# Patient Record
Sex: Female | Born: 1935 | Race: White | Hispanic: No | Smoking: Never smoker
Health system: Southern US, Community
[De-identification: ages and names within clinical notes are randomized; demographics above are authoritative.]

## PROBLEM LIST (undated history)

## (undated) DIAGNOSIS — E785 Hyperlipidemia, unspecified: Secondary | ICD-10-CM

## (undated) DIAGNOSIS — F32A Depression, unspecified: Secondary | ICD-10-CM

## (undated) DIAGNOSIS — F0280 Dementia in other diseases classified elsewhere without behavioral disturbance: Secondary | ICD-10-CM

## (undated) DIAGNOSIS — G309 Alzheimer's disease, unspecified: Secondary | ICD-10-CM

## (undated) DIAGNOSIS — N3281 Overactive bladder: Secondary | ICD-10-CM

## (undated) DIAGNOSIS — Z8781 Personal history of (healed) traumatic fracture: Secondary | ICD-10-CM

## (undated) DIAGNOSIS — F329 Major depressive disorder, single episode, unspecified: Secondary | ICD-10-CM

## (undated) DIAGNOSIS — R42 Dizziness and giddiness: Secondary | ICD-10-CM

## (undated) DIAGNOSIS — R413 Other amnesia: Secondary | ICD-10-CM

## (undated) DIAGNOSIS — G4730 Sleep apnea, unspecified: Secondary | ICD-10-CM

## (undated) HISTORY — PX: KNEE SURGERY: SHX244

## (undated) HISTORY — PX: ANKLE SURGERY: SHX546

## (undated) HISTORY — PX: BACK SURGERY: SHX140

## (undated) HISTORY — DX: Personal history of (healed) traumatic fracture: Z87.81

---

## 1997-08-11 ENCOUNTER — Ambulatory Visit (HOSPITAL_COMMUNITY): Admission: RE | Admit: 1997-08-11 | Discharge: 1997-08-11 | Payer: Self-pay | Admitting: *Deleted

## 1997-08-16 ENCOUNTER — Ambulatory Visit (HOSPITAL_COMMUNITY): Admission: RE | Admit: 1997-08-16 | Discharge: 1997-08-16 | Payer: Self-pay | Admitting: Internal Medicine

## 1998-08-24 ENCOUNTER — Ambulatory Visit (HOSPITAL_COMMUNITY): Admission: RE | Admit: 1998-08-24 | Discharge: 1998-08-24 | Payer: Self-pay | Admitting: Internal Medicine

## 1998-08-24 ENCOUNTER — Ambulatory Visit (HOSPITAL_COMMUNITY): Admission: RE | Admit: 1998-08-24 | Discharge: 1998-08-24 | Payer: Self-pay | Admitting: *Deleted

## 1999-08-02 ENCOUNTER — Ambulatory Visit (HOSPITAL_COMMUNITY): Admission: RE | Admit: 1999-08-02 | Discharge: 1999-08-02 | Payer: Self-pay | Admitting: Obstetrics & Gynecology

## 1999-08-29 ENCOUNTER — Ambulatory Visit (HOSPITAL_COMMUNITY): Admission: RE | Admit: 1999-08-29 | Discharge: 1999-08-29 | Payer: Self-pay | Admitting: Obstetrics & Gynecology

## 2000-10-09 ENCOUNTER — Ambulatory Visit (HOSPITAL_COMMUNITY): Admission: RE | Admit: 2000-10-09 | Discharge: 2000-10-09 | Payer: Self-pay

## 2000-10-09 ENCOUNTER — Ambulatory Visit (HOSPITAL_COMMUNITY): Admission: RE | Admit: 2000-10-09 | Discharge: 2000-10-09 | Payer: Self-pay | Admitting: Obstetrics & Gynecology

## 2001-10-15 ENCOUNTER — Ambulatory Visit (HOSPITAL_COMMUNITY): Admission: RE | Admit: 2001-10-15 | Discharge: 2001-10-15 | Payer: Self-pay | Admitting: Internal Medicine

## 2001-10-15 ENCOUNTER — Ambulatory Visit (HOSPITAL_COMMUNITY): Admission: RE | Admit: 2001-10-15 | Discharge: 2001-10-15 | Payer: Self-pay | Admitting: *Deleted

## 2002-10-21 ENCOUNTER — Ambulatory Visit (HOSPITAL_COMMUNITY): Admission: RE | Admit: 2002-10-21 | Discharge: 2002-10-21 | Payer: Self-pay | Admitting: *Deleted

## 2016-06-06 ENCOUNTER — Emergency Department (HOSPITAL_BASED_OUTPATIENT_CLINIC_OR_DEPARTMENT_OTHER): Payer: Medicare Other

## 2016-06-06 ENCOUNTER — Encounter (HOSPITAL_BASED_OUTPATIENT_CLINIC_OR_DEPARTMENT_OTHER): Payer: Self-pay | Admitting: *Deleted

## 2016-06-06 ENCOUNTER — Emergency Department (HOSPITAL_BASED_OUTPATIENT_CLINIC_OR_DEPARTMENT_OTHER)
Admission: EM | Admit: 2016-06-06 | Discharge: 2016-06-06 | Disposition: A | Discharge status: Home / Self Care | Payer: Medicare Other | Attending: Emergency Medicine | Admitting: Emergency Medicine

## 2016-06-06 DIAGNOSIS — Z79899 Other long term (current) drug therapy: Secondary | ICD-10-CM | POA: Diagnosis not present

## 2016-06-06 DIAGNOSIS — M79642 Pain in left hand: Secondary | ICD-10-CM | POA: Diagnosis present

## 2016-06-06 DIAGNOSIS — M7989 Other specified soft tissue disorders: Secondary | ICD-10-CM | POA: Insufficient documentation

## 2016-06-06 HISTORY — DX: Hyperlipidemia, unspecified: E78.5

## 2016-06-06 HISTORY — DX: Overactive bladder: N32.81

## 2016-06-06 HISTORY — DX: Sleep apnea, unspecified: G47.30

## 2016-06-06 MED ORDER — CEPHALEXIN 500 MG PO CAPS: 500.0000 mg | ORAL_CAPSULE | Freq: Two times a day (BID) | ORAL | 0 refills | Status: AC

## 2016-06-06 NOTE — Discharge Instructions (Signed)
We believe that your symptoms are caused by an injury to the hand  Please read through the included information about additional care such as heating pads, over-the-counter pain medicine.  If you were provided a prescription please use it only as needed and as instructed.  Remember that early mobility and using the affected part of your body is actually better than keeping it immobile.  We are putting you in a wrist brace and will have you get repeat x-rays in 1 week to make sure the brace does not need to stay on.   You may a slight skin infection as well which we will treat with antibiotics for the next week.   Follow-up with the doctor listed as recommended or return to the emergency department with new or worsening symptoms that concern you.

## 2016-06-06 NOTE — ED Provider Notes (Signed)
Emergency Department Provider Note   I have reviewed the triage vital signs and the nursing notes.  Level 5 caveat: Dementia   HISTORY  Chief Complaint Hand Problem   HPI Dawn Gordon is a 83 y.o. female with PMH of dementia, HLD, and sleep apnea resents to the emergency department for evaluation of left wrist pain and swelling. The patient is not sure if she fell and injured the wrist but staff at her assisted living noticed that it was red and swollen. Patient denies any pain with movement. No fevers or chills. He does not recall any falls. History is limited by her underlying dementia.   Past Medical History:  Diagnosis Date  . Hyperlipidemia   . Overactive bladder   . Sleep apnea     There are no active problems to display for this patient.   Past Surgical History:  Procedure Laterality Date  . ANKLE SURGERY    . BACK SURGERY    . KNEE SURGERY      Current Outpatient Rx  . Order #: [id number] Class: Historical Med  . Order #: [id number] Class: Historical Med  . Order #: [id number] Class: Historical Med  . Order #: [id number] Class: Historical Med  . Order #: [id number] Class: Historical Med  . Order #: [id number] Class: Historical Med  . Order #: [id number] Class: Print    Allergies Patient has no known allergies.  No family history on file.  Social History Social History  Substance Use Topics  . Smoking status: Never Smoker  . Smokeless tobacco: Never Used  . Alcohol use No    Review of Systems  Constitutional: No fever/chills Eyes: No visual changes. ENT: No sore throat. Cardiovascular: Denies chest pain. Respiratory: Denies shortness of breath. Gastrointestinal: No abdominal pain.  No nausea, no vomiting.  No diarrhea.  No constipation. Genitourinary: Negative for dysuria. Musculoskeletal: Negative for back pain. Wrist pain and swelling.  Skin: Negative for rash. Neurological: Negative for headaches, focal weakness or numbness.  10-point ROS otherwise  negative.  ____________________________________________   PHYSICAL EXAM:  VITAL SIGNS: Temp: 98.9 F Pulse: 53 BP: 134/70 SpO2: 98% RA  Constitutional: Alert and oriented. Well appearing and in no acute distress. Eyes: Conjunctivae are normal.  Head: Atraumatic. Nose: No congestion/rhinnorhea. Mouth/Throat: Mucous membranes are moist.  Neck: No stridor.   Cardiovascular: Normal rate, regular rhythm. Good peripheral circulation. Grossly normal heart sounds.   Respiratory: Normal respiratory effort.  No retractions. Lungs CTAB. Gastrointestinal: Soft and nontender. No distention.  Musculoskeletal: No lower extremity tenderness nor edema. No gross deformities of extremities. Left wrist erythema, swelling, and mild ecchymosis.  Neurologic:  Normal speech and language. No gross focal neurologic deficits are appreciated.  Skin:  Skin is warm, dry and intact. No rash noted. Psychiatric: Mood and affect are normal. Speech and behavior are normal.  ____________________________________________  RADIOLOGY  No results found.  ____________________________________________   PROCEDURES  Procedure(s) performed:   .Splint Application Date/Time: 06/08/2016 2:20 PM Performed by: Maia Plan Authorized by: Maia Plan   Consent:    Consent obtained:  Verbal   Consent given by:  Patient   Risks discussed:  Discoloration, numbness, pain and swelling   Alternatives discussed:  Delayed treatment Procedure details:    Laterality:  Left   Location:  Wrist   Wrist:  L wrist   Strapping: no     Splint type:  Thumb spica   Supplies:  Prefabricated splint Post-procedure details:    Pain:  Unchanged   Sensation:  Normal  Patient tolerance of procedure:  Tolerated well, no immediate complications    ____________________________________________   INITIAL IMPRESSION / ASSESSMENT AND PLAN / ED COURSE  Pertinent labs & imaging results that were available during my care of the  patient were reviewed by me and considered in my medical decision making (see chart for details).  Patient has left wrist swelling with tenderness over the first metacarpal. Full range of motion of the wrist and fingers with no difficulty. She has some redness and swelling over the wrist with no evidence of abscess. Question possible cellulitis versus edema from recent injury. X-ray shows no acute fracture or dislocation. I placed her in a Velcro splint for support as she has some tenderness to palpation. Plan for repeat x-ray in 1 week to rule out underlying fracture. Plan to also start on Keflex with possible cellulitis. Discussed return precautions for septic arthritis in detail with patient and family at bedside but no concern for that at this time.   At this time, I do not feel there is any life-threatening condition present. I have reviewed and discussed all results (EKG, imaging, lab, urine as appropriate), exam findings with patient. I have reviewed nursing notes and appropriate previous records.  I feel the patient is safe to be discharged home without further emergent workup. Discussed usual and customary return precautions. Patient and family (if present) verbalize understanding and are comfortable with this plan.  Patient will follow-up with their primary care provider. If they do not have a primary care provider, information for follow-up has been provided to them. All questions have been answered.  ____________________________________________  FINAL CLINICAL IMPRESSION(S) / ED DIAGNOSES  Final diagnoses:  Pain of left hand     MEDICATIONS GIVEN DURING THIS VISIT:  None  NEW OUTPATIENT MEDICATIONS STARTED DURING THIS VISIT:  Discharge Medication List as of 06/06/2016  6:14 PM    START taking these medications   Details  cephALEXin (KEFLEX) 500 MG capsule Take 1 capsule (500 mg total) by mouth 2 (two) times daily., Starting Fri 06/06/2016, Until Fri 06/13/2016, Print           Note:  This document was prepared using Dragon voice recognition software and may include unintentional dictation errors.  Alona Bene, MD Emergency Medicine   Maia Plan, MD 06/08/16 [phone]

## 2016-06-06 NOTE — ED Triage Notes (Signed)
Sister is bringing her from [facility] with swelling to her left hand. Unknown injury.

## 2016-06-06 NOTE — ED Notes (Signed)
Patient transported to X-ray 

## 2016-06-13 ENCOUNTER — Emergency Department (HOSPITAL_BASED_OUTPATIENT_CLINIC_OR_DEPARTMENT_OTHER)
Admission: EM | Admit: 2016-06-13 | Discharge: 2016-06-13 | Disposition: A | Discharge status: Home / Self Care | Payer: Medicare Other | Attending: Emergency Medicine | Admitting: Emergency Medicine

## 2016-06-13 ENCOUNTER — Emergency Department (HOSPITAL_BASED_OUTPATIENT_CLINIC_OR_DEPARTMENT_OTHER): Payer: Medicare Other

## 2016-06-13 ENCOUNTER — Encounter (HOSPITAL_BASED_OUTPATIENT_CLINIC_OR_DEPARTMENT_OTHER): Payer: Self-pay | Admitting: *Deleted

## 2016-06-13 DIAGNOSIS — Y939 Activity, unspecified: Secondary | ICD-10-CM | POA: Insufficient documentation

## 2016-06-13 DIAGNOSIS — Y999 Unspecified external cause status: Secondary | ICD-10-CM | POA: Insufficient documentation

## 2016-06-13 DIAGNOSIS — R51 Headache: Secondary | ICD-10-CM | POA: Diagnosis not present

## 2016-06-13 DIAGNOSIS — W19XXXA Unspecified fall, initial encounter: Secondary | ICD-10-CM

## 2016-06-13 DIAGNOSIS — W1839XA Other fall on same level, initial encounter: Secondary | ICD-10-CM | POA: Diagnosis not present

## 2016-06-13 DIAGNOSIS — S20212A Contusion of left front wall of thorax, initial encounter: Secondary | ICD-10-CM | POA: Insufficient documentation

## 2016-06-13 DIAGNOSIS — S299XXA Unspecified injury of thorax, initial encounter: Secondary | ICD-10-CM | POA: Diagnosis present

## 2016-06-13 DIAGNOSIS — Y929 Unspecified place or not applicable: Secondary | ICD-10-CM | POA: Insufficient documentation

## 2016-06-13 NOTE — ED Provider Notes (Signed)
[facility]   CSN: [id number] Arrival date & time: 06/13/16  1429     History   Chief Complaint Chief Complaint  Patient presents with  . Fall    HPI Dawn Gordon is a 83 y.o. female.  Patient is a 83 year old female with a history of dementia and hyperlipidemia who presents with left rib pain. She is brought in by her son. She lives in an assisted living facility. She was believed to have a fall about a week ago. She was seen after what was felt to be a fall with left wrist pain. That has improved but since then she's been complaining of pain in her left ribs. It seems to be worse when she's trying to get up out of bed per her son. She hasn't been complaining of any other injuries. I spoke with the nursing staff at the facility who states that she's been at her baseline mental status. No known fevers. She's been eating normally and has her normal activity level per the staff. They do say that she's been complaining that her head has been hurting.      Past Medical History:  Diagnosis Date  . Hyperlipidemia   . Overactive bladder   . Sleep apnea     There are no active problems to display for this patient.   Past Surgical History:  Procedure Laterality Date  . ANKLE SURGERY    . BACK SURGERY    . KNEE SURGERY      OB History    No data available       Home Medications    Prior to Admission medications   Medication Sig Start Date End Date Taking? Authorizing Provider  cephALEXin (KEFLEX) 500 MG capsule Take 1 capsule (500 mg total) by mouth 2 (two) times daily. 06/06/16 06/13/16  Maia Plan, MD  donepezil (ARICEPT) 10 MG tablet Take 10 mg by mouth at bedtime.    Historical Provider, MD  escitalopram (LEXAPRO) 10 MG tablet Take 10 mg by mouth daily.    Historical Provider, MD  MECLIZINE HCL PO Take by mouth.    Historical Provider, MD  Memantine HCl (NAMENDA PO) Take by mouth.    Historical Provider, MD  SIMVASTATIN PO Take by mouth.     Historical Provider, MD  TRAZODONE HCL PO Take by mouth.    Historical Provider, MD    Family History No family history on file.  Social History Social History  Substance Use Topics  . Smoking status: Never Smoker  . Smokeless tobacco: Never Used  . Alcohol use No     Allergies   Patient has no known allergies.   Review of Systems Review of Systems  Unable to perform ROS: Dementia     Physical Exam Updated Vital Signs BP 140/83   Pulse 65   Temp 98.3 F (36.8 C) (Oral)   Resp 18   Wt 150 lb (68 kg)   SpO2 96%   BMI 29.29 kg/m   Physical Exam  Constitutional: She appears well-developed and well-nourished.  HENT:  Head: Normocephalic and atraumatic.  Eyes: Pupils are equal, round, and reactive to light.  Neck:  Patient has tenderness to the cervical and lumbosacral spine. No significant tenderness to the thoracic spine.  Cardiovascular: Normal rate, regular rhythm and normal heart sounds.   Pulmonary/Chest: Effort normal and breath sounds normal. No respiratory distress. She has no wheezes. She has no rales. She exhibits no tenderness.  There is some  mild tenderness to the left mid and posterior ribs. No crepitus or deformity. No evidence of external trauma  Abdominal: Soft. Bowel sounds are normal. There is no tenderness. There is no rebound and no guarding.  Musculoskeletal: Normal range of motion. She exhibits no edema.  No pain on palpation or range of motion extremities. The left wrist which was swollen earlier this week is currently normal in appearance. There is no warmth or erythema. No visible swelling. No pain on range of motion.  Lymphadenopathy:    She has no cervical adenopathy.  Neurological: She is alert.  Confused, at baseline mental status per family. She's moving all extremities symmetrically without focal deficits.  Skin: Skin is warm and dry. No rash noted.  Psychiatric: She has a normal mood and affect.     ED Treatments / Results   Labs (all labs ordered are listed, but only abnormal results are displayed) Labs Reviewed - No data to display  EKG  EKG Interpretation None       Radiology Dg Ribs Unilateral W/chest Left  Result Date: 06/13/2016 CLINICAL DATA:  Larey Seat 1 week ago, LEFT lateral posterior rib pain EXAM: LEFT RIBS AND CHEST - 3+ VIEW COMPARISON:  Chest radiograph 09/20/2008 FINDINGS: Enlargement of cardiac silhouette. Calcified tortuous aorta. Mediastinal contours and pulmonary vascularity normal. Decreased lung volumes with bibasilar atelectasis. Mild central peribronchial thickening. No infiltrate, pleural effusion or pneumothorax. Bones demineralized. BB placed at the lower lateral LEFT chest. No definite rib fracture or bone destruction identified though the sensitivity for detection of rib fractures is decrease in the setting of significant demineralization. IMPRESSION: Bronchitic changes with minimal bibasilar atelectasis. No definite acute rib abnormalities. Electronically Signed   By: Ulyses Southward M.D.   On: 06/13/2016 15:06   Dg Lumbar Spine Complete  Result Date: 06/13/2016 CLINICAL DATA:  Previous fall with back pain, initial encounter EXAM: LUMBAR SPINE - COMPLETE 4+ VIEW COMPARISON:  07/05/2014 FINDINGS: Five lumbar type vertebral bodies are well visualized. A mild scoliosis concave to the right is noted stable from the prior exam. Vertebral body height is well maintained. Mild facet hypertrophic changes are noted. Osteophytic changes are noted as well. Diffuse aortic calcifications are seen without aneurysmal dilatation. IMPRESSION: Degenerative change without acute abnormality. Electronically Signed   By: Alcide Clever M.D.   On: 06/13/2016 16:00   Ct Head Wo Contrast  Result Date: 06/13/2016 CLINICAL DATA:  Larey Seat 1 week ago, dementia, headache EXAM: CT HEAD WITHOUT CONTRAST CT CERVICAL SPINE WITHOUT CONTRAST TECHNIQUE: Multidetector CT imaging of the head and cervical spine was performed following  the standard protocol without intravenous contrast. Multiplanar CT image reconstructions of the cervical spine were also generated. COMPARISON:  CT head 10/14/2015 FINDINGS: CT HEAD FINDINGS Brain: Generalized atrophy. Normal ventricular morphology. No midline shift or mass effect. Small vessel chronic ischemic changes of deep cerebral white matter. No intracranial hemorrhage, mass lesion, evidence of acute infarction, or extra-axial fluid collection. Vascular: Minimal atherosclerotic calcification at the internal carotid arteries at the skullbase Skull: Intact. Sinuses/Orbits: Clear Other: N/A CT CERVICAL SPINE FINDINGS Alignment: Normal Skull base and vertebrae: Diffuse osseous demineralization. Scattered facet degenerative changes. Vertebral body heights maintained without fracture or subluxation. Visualized skullbase intact. Soft tissues and spinal canal: Prevertebral soft tissues normal thickness. Scattered beam hardening artifacts of dental origin. Nonspecific LEFT parotid mass versus extra parotid mass/lymph node 12 x 10 mm in size. Disc levels: Multilevel disc space narrowing and endplate spur formation. Uncovertebral spurs encroach upon cervical neural foramina bilaterally  greatest at C4-C5. Upper chest: Lung apices clear Other: N/A IMPRESSION: Atrophy with small vessel chronic ischemic changes of deep cerebral white matter. No acute intracranial abnormalities. Degenerative disc and facet disease changes cervical spine with osseous demineralization. No acute cervical spine abnormalities. 12 x 10 mm LEFT parotid mass versus extra parotid mass or mildly enlarged lymph node incidentally noted. Electronically Signed   By: Ulyses Southward M.D.   On: 06/13/2016 16:17   Ct Cervical Spine Wo Contrast  Result Date: 06/13/2016 CLINICAL DATA:  Larey Seat 1 week ago, dementia, headache EXAM: CT HEAD WITHOUT CONTRAST CT CERVICAL SPINE WITHOUT CONTRAST TECHNIQUE: Multidetector CT imaging of the head and cervical spine was  performed following the standard protocol without intravenous contrast. Multiplanar CT image reconstructions of the cervical spine were also generated. COMPARISON:  CT head 10/14/2015 FINDINGS: CT HEAD FINDINGS Brain: Generalized atrophy. Normal ventricular morphology. No midline shift or mass effect. Small vessel chronic ischemic changes of deep cerebral white matter. No intracranial hemorrhage, mass lesion, evidence of acute infarction, or extra-axial fluid collection. Vascular: Minimal atherosclerotic calcification at the internal carotid arteries at the skullbase Skull: Intact. Sinuses/Orbits: Clear Other: N/A CT CERVICAL SPINE FINDINGS Alignment: Normal Skull base and vertebrae: Diffuse osseous demineralization. Scattered facet degenerative changes. Vertebral body heights maintained without fracture or subluxation. Visualized skullbase intact. Soft tissues and spinal canal: Prevertebral soft tissues normal thickness. Scattered beam hardening artifacts of dental origin. Nonspecific LEFT parotid mass versus extra parotid mass/lymph node 12 x 10 mm in size. Disc levels: Multilevel disc space narrowing and endplate spur formation. Uncovertebral spurs encroach upon cervical neural foramina bilaterally greatest at C4-C5. Upper chest: Lung apices clear Other: N/A IMPRESSION: Atrophy with small vessel chronic ischemic changes of deep cerebral white matter. No acute intracranial abnormalities. Degenerative disc and facet disease changes cervical spine with osseous demineralization. No acute cervical spine abnormalities. 12 x 10 mm LEFT parotid mass versus extra parotid mass or mildly enlarged lymph node incidentally noted. Electronically Signed   By: Ulyses Southward M.D.   On: 06/13/2016 16:17    Procedures Procedures (including critical care time)  Medications Ordered in ED Medications - No data to display   Initial Impression / Assessment and Plan / ED Course  I have reviewed the triage vital signs and the  nursing notes.  Pertinent labs & imaging results that were available during my care of the patient were reviewed by me and considered in my medical decision making (see chart for details).     Patient presents after a presumed fall. She's been complaining of left rib pain. No other injuries are apparent. There is no obvious rib fracture. No pneumothorax. She was complaining of a headache and I did a CT of her head and neck which are negative for acute trauma. There is a small mass in the left parotid gland which I did discuss with the son. This can be followed up by her PCP and decision on further treatment can remain at that point. Patient is discharged back to nursing home in good condition. Return precautions were given.  Final Clinical Impressions(s) / ED Diagnoses   Final diagnoses:  Fall, initial encounter  Rib contusion, left, initial encounter    New Prescriptions New Prescriptions   No medications on file     Rolan Bucco, MD 06/13/16 1647

## 2016-06-13 NOTE — Discharge Instructions (Signed)
There was a small mass in the left parotid gland which will need follow up by your primary care provider.

## 2016-06-13 NOTE — ED Triage Notes (Signed)
Pt c/o fall x 1 week ago, seen here for same , now c/o left rib pain and flank pain

## 2017-07-02 ENCOUNTER — Encounter (HOSPITAL_BASED_OUTPATIENT_CLINIC_OR_DEPARTMENT_OTHER): Payer: Self-pay | Admitting: *Deleted

## 2017-07-02 ENCOUNTER — Inpatient Hospital Stay (HOSPITAL_BASED_OUTPATIENT_CLINIC_OR_DEPARTMENT_OTHER)
Admission: EM | Admit: 2017-07-02 | Discharge: 2017-07-07 | DRG: 470 | Disposition: A | Discharge status: Skilled Nursing Facility | Payer: Medicare Other | Attending: Internal Medicine | Admitting: Internal Medicine

## 2017-07-02 ENCOUNTER — Emergency Department (HOSPITAL_BASED_OUTPATIENT_CLINIC_OR_DEPARTMENT_OTHER): Payer: Medicare Other

## 2017-07-02 ENCOUNTER — Other Ambulatory Visit: Payer: Self-pay

## 2017-07-02 DIAGNOSIS — Z79899 Other long term (current) drug therapy: Secondary | ICD-10-CM

## 2017-07-02 DIAGNOSIS — G4730 Sleep apnea, unspecified: Secondary | ICD-10-CM | POA: Diagnosis present

## 2017-07-02 DIAGNOSIS — Z419 Encounter for procedure for purposes other than remedying health state, unspecified: Secondary | ICD-10-CM

## 2017-07-02 DIAGNOSIS — S72009A Fracture of unspecified part of neck of unspecified femur, initial encounter for closed fracture: Secondary | ICD-10-CM | POA: Diagnosis not present

## 2017-07-02 DIAGNOSIS — W1830XA Fall on same level, unspecified, initial encounter: Secondary | ICD-10-CM | POA: Diagnosis present

## 2017-07-02 DIAGNOSIS — S72012A Unspecified intracapsular fracture of left femur, initial encounter for closed fracture: Secondary | ICD-10-CM | POA: Diagnosis not present

## 2017-07-02 DIAGNOSIS — N3281 Overactive bladder: Secondary | ICD-10-CM | POA: Diagnosis present

## 2017-07-02 DIAGNOSIS — S72002A Fracture of unspecified part of neck of left femur, initial encounter for closed fracture: Secondary | ICD-10-CM | POA: Diagnosis present

## 2017-07-02 DIAGNOSIS — Z09 Encounter for follow-up examination after completed treatment for conditions other than malignant neoplasm: Secondary | ICD-10-CM

## 2017-07-02 DIAGNOSIS — D62 Acute posthemorrhagic anemia: Secondary | ICD-10-CM | POA: Diagnosis not present

## 2017-07-02 DIAGNOSIS — F0280 Dementia in other diseases classified elsewhere without behavioral disturbance: Secondary | ICD-10-CM | POA: Diagnosis present

## 2017-07-02 DIAGNOSIS — E785 Hyperlipidemia, unspecified: Secondary | ICD-10-CM | POA: Diagnosis present

## 2017-07-02 DIAGNOSIS — Z96659 Presence of unspecified artificial knee joint: Secondary | ICD-10-CM

## 2017-07-02 DIAGNOSIS — Z96652 Presence of left artificial knee joint: Secondary | ICD-10-CM

## 2017-07-02 DIAGNOSIS — S72001A Fracture of unspecified part of neck of right femur, initial encounter for closed fracture: Secondary | ICD-10-CM

## 2017-07-02 DIAGNOSIS — G309 Alzheimer's disease, unspecified: Secondary | ICD-10-CM | POA: Diagnosis present

## 2017-07-02 HISTORY — DX: Other amnesia: R41.3

## 2017-07-02 LAB — COMPREHENSIVE METABOLIC PANEL
ALK PHOS: 136 U/L — AB (ref 38–126)
ALT: 11 U/L — AB (ref 14–54)
AST: 16 U/L (ref 15–41)
Albumin: 3.5 g/dL (ref 3.5–5.0)
Anion gap: 11 (ref 5–15)
BILIRUBIN TOTAL: 1 mg/dL (ref 0.3–1.2)
BUN: 17 mg/dL (ref 6–20)
CALCIUM: 8.6 mg/dL — AB (ref 8.9–10.3)
CO2: 22 mmol/L (ref 22–32)
Chloride: 105 mmol/L (ref 101–111)
Creatinine, Ser: 0.62 mg/dL (ref 0.44–1.00)
GFR calc non Af Amer: >60 mL/min (ref >60)
Glucose, Bld: 95 mg/dL (ref 65–99)
Potassium: 3.3 mmol/L — ABNORMAL LOW (ref 3.5–5.1)
Sodium: 138 mmol/L (ref 135–145)
TOTAL PROTEIN: 6.8 g/dL (ref 6.5–8.1)

## 2017-07-02 LAB — PROTIME-INR
INR: 1.51
Prothrombin Time: 18 seconds — ABNORMAL HIGH (ref 11.4–15.2)

## 2017-07-02 MED ORDER — FENTANYL CITRATE (PF) 100 MCG/2ML IJ SOLN
12.5000 ug | Freq: Once | INTRAMUSCULAR | Status: AC
  Administered 2017-07-02: 12.5 ug via INTRAVENOUS
  Filled 2017-07-02: qty 2

## 2017-07-02 NOTE — ED Triage Notes (Signed)
Per EMS: pt from [facility]. Pt with dementia-A/O per her norm. Chronic back pain worsening today. Denies known injury. VSS.  Pt was seen by PCP today and rx pain medication.

## 2017-07-02 NOTE — ED Provider Notes (Signed)
[facility] EMERGENCY DEPARTMENT Provider Note   CSN: [id number] Arrival date & time: 07/02/17  2105     History   Chief Complaint Chief Complaint  Patient presents with  . Back Pain    HPI Dawn Gordon is a 84 y.o. female.  The history is provided by a relative. The history is limited by the condition of the patient.  Hip Pain  This is a new problem. The current episode started more than 2 days ago. The problem occurs constantly. The problem has been gradually worsening. Pertinent negatives include no chest pain, no abdominal pain, no headaches and no shortness of breath. The symptoms are aggravated by twisting and bending. Nothing relieves the symptoms.   LVL 5 caveat for dementia  Past Medical History:  Diagnosis Date  . Amnesia   . Hyperlipidemia   . Overactive bladder   . Sleep apnea     There are no active problems to display for this patient.   Past Surgical History:  Procedure Laterality Date  . ANKLE SURGERY    . BACK SURGERY    . KNEE SURGERY       OB History   None      Home Medications    Prior to Admission medications   Medication Sig Start Date End Date Taking? Authorizing Provider  donepezil (ARICEPT) 10 MG tablet Take 10 mg by mouth at bedtime.    [provider]  escitalopram (LEXAPRO) 10 MG tablet Take 10 mg by mouth daily.    [provider]  MECLIZINE HCL PO Take by mouth.    [provider]  Memantine HCl (NAMENDA PO) Take by mouth.    [provider]  SIMVASTATIN PO Take by mouth.    [provider]  TRAZODONE HCL PO Take by mouth.    [provider]    Family History History reviewed. No pertinent family history.  Social History Social History   Tobacco Use  . Smoking status: Never Smoker  . Smokeless tobacco: Never Used  Substance Use Topics  . Alcohol use: No  . Drug use: Not on file     Allergies   Patient has no known allergies.   Review of  Systems Review of Systems  Unable to perform ROS: Dementia  Constitutional: Negative for chills and fever.  HENT: Negative for congestion.   Respiratory: Negative for chest tightness and shortness of breath.   Cardiovascular: Negative for chest pain.  Gastrointestinal: Negative for abdominal pain.  Genitourinary: Negative for dysuria.  Neurological: Negative for headaches.     Physical Exam Updated Vital Signs BP (!) 157/90 (BP Location: Right Arm)   Pulse 91   Temp 98.4 F (36.9 C) (Oral)   Resp (!) 24   Wt 68 kg (150 lb)   SpO2 96%   BMI 29.29 kg/m   Physical Exam  Constitutional: She appears well-developed and well-nourished. No distress.  HENT:  Head: Normocephalic and atraumatic.  Right Ear: External ear normal.  Left Ear: External ear normal.  Nose: Nose normal.  Mouth/Throat: Oropharynx is clear and moist. No oropharyngeal exudate.  Eyes: Pupils are equal, round, and reactive to light. Conjunctivae and EOM are normal.  Neck: Normal range of motion. Neck supple.  Cardiovascular: Normal rate.  Pulmonary/Chest: No stridor. No respiratory distress.  Abdominal: She exhibits no distension. There is no tenderness. There is no rebound.  Musculoskeletal: She exhibits tenderness and deformity.       Left hip: She exhibits tenderness.  Legs: Lymphadenopathy:    She has no cervical adenopathy.  Neurological: She has normal reflexes. No sensory deficit. She exhibits normal muscle tone.  Skin: Skin is warm. Capillary refill takes less than 2 seconds. No rash noted. She is not diaphoretic. No erythema.  Psychiatric: She has a normal mood and affect.  Nursing note and vitals reviewed.    ED Treatments / Results  Labs (all labs ordered are listed, but only abnormal results are displayed) Labs Reviewed  PROTIME-INR - Abnormal; Notable for the following components:      Result Value   Prothrombin Time 18.0 (*)    All other components within normal limits  CBC WITH  DIFFERENTIAL/PLATELET - Abnormal; Notable for the following components:   WBC 10.6 (*)    All other components within normal limits  COMPREHENSIVE METABOLIC PANEL - Abnormal; Notable for the following components:   Potassium 3.3 (*)    Calcium 8.6 (*)    ALT 11 (*)    Alkaline Phosphatase 136 (*)    All other components within normal limits  CBC WITH DIFFERENTIAL/PLATELET    EKG EKG Interpretation  Date/Time:  Thursday Jul 02 2017 22:18:52 EDT Ventricular Rate:  79 PR Interval:    QRS Duration: 76 QT Interval:  502 QTC Calculation: 576 R Axis:   -20 Text Interpretation:  Sinus rhythm Inferior infarct, old Lateral leads are also involved Prolonged QT interval No prior ECG for comparison.  No STEMI Confirmed by Theda Belfast ([id number]) on 07/02/2017 10:35:57 PM   Radiology Dg Chest 1 View  Result Date: 07/02/2017 CLINICAL DATA:  Hip fracture EXAM: CHEST  1 VIEW COMPARISON:  06/13/2016 FINDINGS: Mildly low lung volumes. Mild diffuse coarse interstitial opacity felt consistent with chronic change. No acute consolidation. Mild cardiomegaly. Aortic atherosclerosis. No pneumothorax. IMPRESSION: No active disease.  Borderline to mild cardiomegaly. Electronically Signed   By: Jasmine Pang M.D.   On: 07/02/2017 23:24   Dg Knee 1-2 Views Left  Result Date: 07/03/2017 CLINICAL DATA:  84 year old female with left hip fracture. Prior left knee replacement. EXAM: LEFT KNEE - 1-2 VIEW COMPARISON:  Left hip radiograph dated 07/02/2017 FINDINGS: There is a total knee arthroplasty. The arthroplasty components appear intact and in anatomic alignment. No evidence of loosening. The bones are osteopenic. There is no acute fracture or dislocation. No joint effusion. The soft tissues appear unremarkable. IMPRESSION: 1. No acute fracture or dislocation. 2. Osteopenia. 3. Total knee arthroplasty appears intact. Electronically Signed   By: Elgie Collard M.D.   On: 07/03/2017 00:49   Dg Hip Unilat W Or Wo Pelvis  2-3 Views Left  Result Date: 07/02/2017 CLINICAL DATA:  Unwitnessed fall EXAM: DG HIP (WITH OR WITHOUT PELVIS) 2-3V LEFT COMPARISON:  None. FINDINGS: Mild SI joint degenerative changes. Pubic symphysis and rami are intact. Both femoral heads project in joint. Acute left femoral neck fracture with cephalad migration of the trochanter. Left femoral head projects in joint. Multiple phleboliths in the pelvis. IMPRESSION: Acute displaced left femoral neck fracture Electronically Signed   By: Jasmine Pang M.D.   On: 07/02/2017 23:23    Procedures Procedures (including critical care time)  Medications Ordered in ED Medications  fentaNYL (SUBLIMAZE) injection 12.5 mcg (12.5 mcg Intravenous Given 07/02/17 2341)     Initial Impression / Assessment and Plan / ED Course  I have reviewed the triage vital signs and the nursing notes.  Pertinent labs & imaging results that were available during my care of the patient were reviewed by  me and considered in my medical decision making (see chart for details).     Dawn Gordon is a 84 y.o. female with a past medical history significant for Alzheimer's dementia and hyperlipidemia who presents with left hip pain.  Patient is accompanied by family who reports that she has had some left hip pain for the last few days.  Reportedly, 3 days ago she had a normal hip x-ray however she saw the doctor again today who ordered a hip x-ray now showing a left femoral neck fracture.  Patient is unable to describe any fall and the staff at the facility did not tell the son about any recent trauma.  Patient otherwise is only complaining of pain in the left hip and does not have any other evidence of trauma.   she denies any headaches, neck pain, chest pain, or back pain.  No abdominal pain.  No pain in the actual legs.  On exam, patient is tenderness in the left hip.  Left leg was somewhat shortened.  Pain with leg manipulation.  Normal sensation and pulses in lower extremities.   Patient could wiggle her toes normally.  Normal plantarflexion bilaterally.  Abdomen and chest were nontender.  Lungs were clear.  Patient had imaging which confirmed the left femoral neck fracture.  No evidence of other pelvic fracture.  Patient had screening laboratory testing and imaging in order to prepare her for a preoperative management.  Orthopedics was called and Dr. Linna Caprice recommended she be admitted to the hospital service at [facility] long.  He requested her be n.p.o. at midnight and then not get any chemical DVT prophylaxis.  He will likely operate on her tomorrow morning.  Patient's EKG showed no STEMI and other laboratory testing showed anemia and mild hypokalemia.  Normal kidney function.  INR was 1.51.  Hospitalist team will be called for admission to [facility].   Final Clinical Impressions(s) / ED Diagnoses   Final diagnoses:  Hip fracture (HCC)  Closed fracture of right hip, initial encounter (HCC)  History of total left knee replacement    ED Discharge Orders    None      Clinical Impression: 1. Hip fracture (HCC)   2. Closed fracture of right hip, initial encounter (HCC)   3. History of total left knee replacement     Disposition: Admit  This note was prepared with assistance of Dragon voice recognition software. Occasional wrong-word or sound-a-like substitutions may have occurred due to the inherent limitations of voice recognition software.     Channon Ambrosini, Canary Brim, MD 07/03/17 [phone]

## 2017-07-03 ENCOUNTER — Inpatient Hospital Stay (HOSPITAL_COMMUNITY): Payer: Medicare Other

## 2017-07-03 ENCOUNTER — Emergency Department (HOSPITAL_BASED_OUTPATIENT_CLINIC_OR_DEPARTMENT_OTHER): Payer: Medicare Other

## 2017-07-03 ENCOUNTER — Inpatient Hospital Stay (HOSPITAL_COMMUNITY): Payer: Medicare Other | Admitting: Certified Registered Nurse Anesthetist

## 2017-07-03 ENCOUNTER — Encounter (HOSPITAL_COMMUNITY)
Admission: EM | Disposition: A | Discharge status: Skilled Nursing Facility | Payer: Self-pay | Source: Home / Self Care | Attending: Internal Medicine

## 2017-07-03 DIAGNOSIS — S72012A Unspecified intracapsular fracture of left femur, initial encounter for closed fracture: Secondary | ICD-10-CM | POA: Diagnosis present

## 2017-07-03 DIAGNOSIS — N3281 Overactive bladder: Secondary | ICD-10-CM | POA: Diagnosis present

## 2017-07-03 DIAGNOSIS — Z8781 Personal history of (healed) traumatic fracture: Secondary | ICD-10-CM

## 2017-07-03 DIAGNOSIS — Z79899 Other long term (current) drug therapy: Secondary | ICD-10-CM | POA: Diagnosis not present

## 2017-07-03 DIAGNOSIS — E785 Hyperlipidemia, unspecified: Secondary | ICD-10-CM | POA: Diagnosis present

## 2017-07-03 DIAGNOSIS — S72009A Fracture of unspecified part of neck of unspecified femur, initial encounter for closed fracture: Secondary | ICD-10-CM | POA: Diagnosis present

## 2017-07-03 DIAGNOSIS — S72002A Fracture of unspecified part of neck of left femur, initial encounter for closed fracture: Secondary | ICD-10-CM | POA: Diagnosis not present

## 2017-07-03 DIAGNOSIS — G4730 Sleep apnea, unspecified: Secondary | ICD-10-CM | POA: Diagnosis present

## 2017-07-03 DIAGNOSIS — W1830XA Fall on same level, unspecified, initial encounter: Secondary | ICD-10-CM | POA: Diagnosis present

## 2017-07-03 DIAGNOSIS — F0280 Dementia in other diseases classified elsewhere without behavioral disturbance: Secondary | ICD-10-CM | POA: Diagnosis present

## 2017-07-03 DIAGNOSIS — G309 Alzheimer's disease, unspecified: Secondary | ICD-10-CM | POA: Diagnosis present

## 2017-07-03 DIAGNOSIS — D62 Acute posthemorrhagic anemia: Secondary | ICD-10-CM | POA: Diagnosis not present

## 2017-07-03 HISTORY — PX: ANTERIOR APPROACH HEMI HIP ARTHROPLASTY: SHX6690

## 2017-07-03 HISTORY — DX: Personal history of (healed) traumatic fracture: Z87.81

## 2017-07-03 LAB — TYPE AND SCREEN
ABO/RH(D): A POS
ANTIBODY SCREEN: NEGATIVE

## 2017-07-03 LAB — CBC WITH DIFFERENTIAL/PLATELET
Basophils Absolute: 0 10*3/uL (ref 0.0–0.1)
Basophils Relative: 0 %
Eosinophils Absolute: 0.2 10*3/uL (ref 0.0–0.7)
Eosinophils Relative: 2 %
HEMATOCRIT: 41.7 % (ref 36.0–46.0)
Hemoglobin: 13.7 g/dL (ref 12.0–15.0)
LYMPHS ABS: 1.4 10*3/uL (ref 0.7–4.0)
Lymphocytes Relative: 13 %
MCH: 30.9 pg (ref 26.0–34.0)
MCHC: 32.9 g/dL (ref 30.0–36.0)
MCV: 94.1 fL (ref 78.0–100.0)
MONO ABS: 1.2 10*3/uL (ref 0.1–1.0)
MONOS PCT: 11 %
NEUTROS ABS: 7.9 10*3/uL (ref 1.7–7.7)
NEUTROS PCT: 74 %
Platelets: ADEQUATE 10*3/uL (ref 150–400)
RBC: 4.43 MIL/uL (ref 3.87–5.11)
RDW: 14.5 % (ref 11.5–15.5)
WBC: 10.6 10*3/uL — ABNORMAL HIGH (ref 4.0–10.5)

## 2017-07-03 LAB — CBC
HCT: 37.7 % (ref 36.0–46.0)
Hemoglobin: 11.9 g/dL — ABNORMAL LOW (ref 12.0–15.0)
MCH: 30.1 pg (ref 26.0–34.0)
MCHC: 31.6 g/dL (ref 30.0–36.0)
MCV: 95.4 fL (ref 78.0–100.0)
Platelets: 196 10*3/uL (ref 150–400)
RBC: 3.95 MIL/uL (ref 3.87–5.11)
RDW: 14.4 % (ref 11.5–15.5)
WBC: 14.9 10*3/uL — ABNORMAL HIGH (ref 4.0–10.5)

## 2017-07-03 LAB — CREATININE, SERUM
CREATININE: 0.71 mg/dL (ref 0.44–1.00)
GFR calc Af Amer: >60 mL/min (ref >60)
GFR calc non Af Amer: >60 mL/min (ref >60)

## 2017-07-03 LAB — MRSA PCR SCREENING: MRSA by PCR: NEGATIVE

## 2017-07-03 SURGERY — HEMIARTHROPLASTY, HIP, DIRECT ANTERIOR APPROACH, FOR FRACTURE
Anesthesia: General | Site: Hip | Laterality: Left

## 2017-07-03 MED ORDER — LACTATED RINGERS IV SOLN
INTRAVENOUS | Status: DC | PRN
  Administered 2017-07-03: 13:00:00 via INTRAVENOUS

## 2017-07-03 MED ORDER — CEFAZOLIN SODIUM-DEXTROSE 2-4 GM/100ML-% IV SOLN: 2.0000 g | INTRAVENOUS | Status: DC

## 2017-07-03 MED ORDER — ONDANSETRON HCL 4 MG/2ML IJ SOLN
INTRAMUSCULAR | Status: DC | PRN
  Administered 2017-07-03: 4 mg via INTRAVENOUS

## 2017-07-03 MED ORDER — PHENOL 1.4 % MT LIQD
1.0000 | OROMUCOSAL | Status: DC | PRN
  Filled 2017-07-03: qty 177

## 2017-07-03 MED ORDER — MECLIZINE HCL 25 MG PO TABS: 12.5000 mg | ORAL_TABLET | Freq: Three times a day (TID) | ORAL | Status: DC | PRN

## 2017-07-03 MED ORDER — ONDANSETRON HCL 4 MG/2ML IJ SOLN
INTRAMUSCULAR | Status: AC
  Filled 2017-07-03: qty 2

## 2017-07-03 MED ORDER — SIMVASTATIN 40 MG PO TABS
40.0000 mg | ORAL_TABLET | Freq: Every day | ORAL | Status: DC
  Administered 2017-07-04 – 2017-07-06 (×3): 40 mg via ORAL
  Filled 2017-07-03 (×3): qty 2

## 2017-07-03 MED ORDER — TRANEXAMIC ACID 1000 MG/10ML IV SOLN
1000.0000 mg | INTRAVENOUS | Status: AC
  Administered 2017-07-03: 1000 mg via INTRAVENOUS
  Filled 2017-07-03: qty 10
  Filled 2017-07-03: qty 1100

## 2017-07-03 MED ORDER — FENTANYL CITRATE (PF) 100 MCG/2ML IJ SOLN
INTRAMUSCULAR | Status: AC
  Filled 2017-07-03: qty 2

## 2017-07-03 MED ORDER — STERILE WATER FOR IRRIGATION IR SOLN
Status: DC | PRN
  Administered 2017-07-03: 2000 mL

## 2017-07-03 MED ORDER — KETOROLAC TROMETHAMINE 30 MG/ML IJ SOLN
INTRAMUSCULAR | Status: DC | PRN
  Administered 2017-07-03: 30 mg

## 2017-07-03 MED ORDER — LACTATED RINGERS IV SOLN
INTRAVENOUS | Status: DC
  Administered 2017-07-03: 13:00:00 via INTRAVENOUS

## 2017-07-03 MED ORDER — CEFAZOLIN SODIUM-DEXTROSE 2-3 GM-%(50ML) IV SOLR
INTRAVENOUS | Status: DC | PRN
  Administered 2017-07-03: 2 g via INTRAVENOUS

## 2017-07-03 MED ORDER — SODIUM CHLORIDE 0.9 % IV SOLN: Freq: Once | INTRAVENOUS | Status: DC

## 2017-07-03 MED ORDER — MEMANTINE HCL 10 MG PO TABS
10.0000 mg | ORAL_TABLET | Freq: Two times a day (BID) | ORAL | Status: DC
  Administered 2017-07-03 – 2017-07-07 (×8): 10 mg via ORAL
  Filled 2017-07-03 (×8): qty 1

## 2017-07-03 MED ORDER — ACETAMINOPHEN 10 MG/ML IV SOLN: 1000.0000 mg | INTRAVENOUS | Status: DC

## 2017-07-03 MED ORDER — BUPIVACAINE HCL (PF) 0.25 % IJ SOLN
INTRAMUSCULAR | Status: AC
  Filled 2017-07-03: qty 30

## 2017-07-03 MED ORDER — ACETAMINOPHEN 10 MG/ML IV SOLN: 1000.0000 mg | Freq: Once | INTRAVENOUS | Status: DC | PRN

## 2017-07-03 MED ORDER — DONEPEZIL HCL 10 MG PO TABS
10.0000 mg | ORAL_TABLET | Freq: Every day | ORAL | Status: DC
  Administered 2017-07-03 – 2017-07-06 (×4): 10 mg via ORAL
  Filled 2017-07-03 (×4): qty 1

## 2017-07-03 MED ORDER — HYDROCODONE-ACETAMINOPHEN 5-325 MG PO TABS
1.0000 | ORAL_TABLET | Freq: Four times a day (QID) | ORAL | Status: DC | PRN
  Administered 2017-07-03: 2 via ORAL
  Administered 2017-07-04 – 2017-07-05 (×5): 1 via ORAL
  Filled 2017-07-03 (×5): qty 1
  Filled 2017-07-03: qty 2
  Filled 2017-07-03: qty 1

## 2017-07-03 MED ORDER — LACTATED RINGERS IV SOLN
INTRAVENOUS | Status: DC | PRN
  Administered 2017-07-03: 14:00:00 via INTRAVENOUS

## 2017-07-03 MED ORDER — MENTHOL 3 MG MT LOZG: 1.0000 | LOZENGE | OROMUCOSAL | Status: DC | PRN

## 2017-07-03 MED ORDER — SODIUM CHLORIDE 0.9 % IJ SOLN
INTRAMUSCULAR | Status: AC
  Filled 2017-07-03: qty 50

## 2017-07-03 MED ORDER — ROCURONIUM BROMIDE 50 MG/5ML IV SOSY
PREFILLED_SYRINGE | INTRAVENOUS | Status: DC | PRN
  Administered 2017-07-03: 50 mg via INTRAVENOUS

## 2017-07-03 MED ORDER — METOCLOPRAMIDE HCL 5 MG/ML IJ SOLN: 5.0000 mg | Freq: Three times a day (TID) | INTRAMUSCULAR | Status: DC | PRN

## 2017-07-03 MED ORDER — DEXMEDETOMIDINE HCL IN NACL 200 MCG/50ML IV SOLN
INTRAVENOUS | Status: AC
  Filled 2017-07-03: qty 50

## 2017-07-03 MED ORDER — DEXMEDETOMIDINE HCL 200 MCG/2ML IV SOLN
INTRAVENOUS | Status: DC | PRN
  Administered 2017-07-03 (×2): 4 ug via INTRAVENOUS

## 2017-07-03 MED ORDER — ACETAMINOPHEN 10 MG/ML IV SOLN
INTRAVENOUS | Status: AC
  Filled 2017-07-03: qty 100

## 2017-07-03 MED ORDER — TRAZODONE HCL 50 MG PO TABS
50.0000 mg | ORAL_TABLET | Freq: Every day | ORAL | Status: DC
  Administered 2017-07-03 – 2017-07-06 (×4): 50 mg via ORAL
  Filled 2017-07-03 (×4): qty 1

## 2017-07-03 MED ORDER — ONDANSETRON HCL 4 MG/2ML IJ SOLN: 4.0000 mg | Freq: Once | INTRAMUSCULAR | Status: DC | PRN

## 2017-07-03 MED ORDER — ESCITALOPRAM OXALATE 10 MG PO TABS
10.0000 mg | ORAL_TABLET | Freq: Every day | ORAL | Status: DC
  Administered 2017-07-04 – 2017-07-07 (×4): 10 mg via ORAL
  Filled 2017-07-03 (×4): qty 1

## 2017-07-03 MED ORDER — PROPOFOL 10 MG/ML IV BOLUS
INTRAVENOUS | Status: AC
  Filled 2017-07-03: qty 40

## 2017-07-03 MED ORDER — CHLORHEXIDINE GLUCONATE 4 % EX LIQD
60.0000 mL | Freq: Once | CUTANEOUS | Status: DC
  Filled 2017-07-03: qty 60

## 2017-07-03 MED ORDER — TRANEXAMIC ACID 1000 MG/10ML IV SOLN
1000.0000 mg | INTRAVENOUS | Status: DC
  Filled 2017-07-03: qty 10

## 2017-07-03 MED ORDER — BUPIVACAINE HCL (PF) 0.25 % IJ SOLN
INTRAMUSCULAR | Status: DC | PRN
  Administered 2017-07-03: 30 mL

## 2017-07-03 MED ORDER — DEXAMETHASONE SODIUM PHOSPHATE 10 MG/ML IJ SOLN
INTRAMUSCULAR | Status: AC
  Filled 2017-07-03: qty 1

## 2017-07-03 MED ORDER — SODIUM CHLORIDE 0.9 % IR SOLN
Status: DC | PRN
  Administered 2017-07-03: 1000 mL

## 2017-07-03 MED ORDER — LIDOCAINE 2% (20 MG/ML) 5 ML SYRINGE
INTRAMUSCULAR | Status: DC | PRN
  Administered 2017-07-03: 60 mg via INTRAVENOUS

## 2017-07-03 MED ORDER — POVIDONE-IODINE 10 % EX SWAB: 2.0000 "application " | Freq: Once | CUTANEOUS | Status: DC

## 2017-07-03 MED ORDER — DOCUSATE SODIUM 100 MG PO CAPS
100.0000 mg | ORAL_CAPSULE | Freq: Two times a day (BID) | ORAL | Status: DC
  Administered 2017-07-03 – 2017-07-07 (×8): 100 mg via ORAL
  Filled 2017-07-03 (×8): qty 1

## 2017-07-03 MED ORDER — 0.9 % SODIUM CHLORIDE (POUR BTL) OPTIME
TOPICAL | Status: DC | PRN
  Administered 2017-07-03: 1000 mL

## 2017-07-03 MED ORDER — SODIUM CHLORIDE 0.9 % IJ SOLN
INTRAMUSCULAR | Status: DC | PRN
  Administered 2017-07-03: 29 mL

## 2017-07-03 MED ORDER — CEFAZOLIN SODIUM-DEXTROSE 2-4 GM/100ML-% IV SOLN
INTRAVENOUS | Status: AC
  Filled 2017-07-03: qty 100

## 2017-07-03 MED ORDER — CEFAZOLIN SODIUM-DEXTROSE 2-4 GM/100ML-% IV SOLN
2.0000 g | Freq: Four times a day (QID) | INTRAVENOUS | Status: AC
  Administered 2017-07-03 – 2017-07-04 (×2): 2 g via INTRAVENOUS
  Filled 2017-07-03 (×2): qty 100

## 2017-07-03 MED ORDER — ONDANSETRON HCL 4 MG/2ML IJ SOLN: 4.0000 mg | Freq: Four times a day (QID) | INTRAMUSCULAR | Status: DC | PRN

## 2017-07-03 MED ORDER — ENSURE ENLIVE PO LIQD
237.0000 mL | Freq: Two times a day (BID) | ORAL | Status: DC
  Administered 2017-07-04 – 2017-07-07 (×4): 237 mL via ORAL

## 2017-07-03 MED ORDER — KETOROLAC TROMETHAMINE 30 MG/ML IJ SOLN
INTRAMUSCULAR | Status: AC
  Filled 2017-07-03: qty 1

## 2017-07-03 MED ORDER — SUGAMMADEX SODIUM 200 MG/2ML IV SOLN
INTRAVENOUS | Status: DC | PRN
  Administered 2017-07-03: 150 mg via INTRAVENOUS

## 2017-07-03 MED ORDER — PROSIGHT PO TABS
ORAL_TABLET | Freq: Every day | ORAL | Status: DC
  Administered 2017-07-04 – 2017-07-07 (×4): 1 via ORAL
  Filled 2017-07-03 (×4): qty 1

## 2017-07-03 MED ORDER — ISOPROPYL ALCOHOL 70 % SOLN
Status: DC | PRN
  Administered 2017-07-03: 1 via TOPICAL

## 2017-07-03 MED ORDER — LABETALOL HCL 5 MG/ML IV SOLN
INTRAVENOUS | Status: DC | PRN
  Administered 2017-07-03: 2.5 mg via INTRAVENOUS

## 2017-07-03 MED ORDER — BUPROPION HCL 75 MG PO TABS
75.0000 mg | ORAL_TABLET | Freq: Two times a day (BID) | ORAL | Status: DC
  Administered 2017-07-03 – 2017-07-07 (×8): 75 mg via ORAL
  Filled 2017-07-03 (×10): qty 1

## 2017-07-03 MED ORDER — METOCLOPRAMIDE HCL 5 MG PO TABS: 5.0000 mg | ORAL_TABLET | Freq: Three times a day (TID) | ORAL | Status: DC | PRN

## 2017-07-03 MED ORDER — MORPHINE SULFATE (PF) 2 MG/ML IV SOLN: 0.5000 mg | INTRAVENOUS | Status: DC | PRN

## 2017-07-03 MED ORDER — ONDANSETRON HCL 4 MG PO TABS
4.0000 mg | ORAL_TABLET | Freq: Four times a day (QID) | ORAL | Status: DC | PRN
Start: 2017-07-03 — End: 2017-07-07

## 2017-07-03 MED ORDER — POLYVINYL ALCOHOL 1.4 % OP SOLN: 1.0000 [drp] | Freq: Three times a day (TID) | OPHTHALMIC | Status: DC | PRN

## 2017-07-03 MED ORDER — PROPOFOL 10 MG/ML IV BOLUS
INTRAVENOUS | Status: DC | PRN
  Administered 2017-07-03: 120 mg via INTRAVENOUS

## 2017-07-03 MED ORDER — LIDOCAINE 2% (20 MG/ML) 5 ML SYRINGE
INTRAMUSCULAR | Status: AC
  Filled 2017-07-03: qty 5

## 2017-07-03 MED ORDER — DEXAMETHASONE SODIUM PHOSPHATE 10 MG/ML IJ SOLN
INTRAMUSCULAR | Status: DC | PRN
  Administered 2017-07-03: 10 mg via INTRAVENOUS

## 2017-07-03 MED ORDER — FENTANYL CITRATE (PF) 100 MCG/2ML IJ SOLN: 25.0000 ug | INTRAMUSCULAR | Status: DC | PRN

## 2017-07-03 MED ORDER — FENTANYL CITRATE (PF) 100 MCG/2ML IJ SOLN
INTRAMUSCULAR | Status: DC | PRN
  Administered 2017-07-03 (×6): 25 ug via INTRAVENOUS

## 2017-07-03 MED ORDER — ENOXAPARIN SODIUM 30 MG/0.3ML ~~LOC~~ SOLN
30.0000 mg | SUBCUTANEOUS | Status: DC
  Administered 2017-07-04 – 2017-07-06 (×3): 30 mg via SUBCUTANEOUS
  Filled 2017-07-03 (×3): qty 0.3

## 2017-07-03 MED ORDER — ACETAMINOPHEN 10 MG/ML IV SOLN
INTRAVENOUS | Status: DC | PRN
  Administered 2017-07-03: 1000 mg via INTRAVENOUS

## 2017-07-03 SURGICAL SUPPLY — 51 items
ADH SKN CLS APL DERMABOND .7 (GAUZE/BANDAGES/DRESSINGS) ×2
BAG SPEC THK2 15X12 ZIP CLS (MISCELLANEOUS) ×1
BAG ZIPLOCK 12X15 (MISCELLANEOUS) ×2 IMPLANT
CAPT HIP HEMI 2 ×2 IMPLANT
CHLORAPREP W/TINT 26ML (MISCELLANEOUS) ×3 IMPLANT
CLOTH BEACON ORANGE TIMEOUT ST (SAFETY) ×3 IMPLANT
COVER PERINEAL POST (MISCELLANEOUS) ×3 IMPLANT
COVER SURGICAL LIGHT HANDLE (MISCELLANEOUS) ×3 IMPLANT
DECANTER SPIKE VIAL GLASS SM (MISCELLANEOUS) ×3 IMPLANT
DERMABOND ADVANCED (GAUZE/BANDAGES/DRESSINGS) ×4
DERMABOND ADVANCED .7 DNX12 (GAUZE/BANDAGES/DRESSINGS) ×2 IMPLANT
DRAPE SHEET LG 3/4 BI-LAMINATE (DRAPES) ×6 IMPLANT
DRAPE STERI IOBAN 125X83 (DRAPES) ×3 IMPLANT
DRAPE U-SHAPE 47X51 STRL (DRAPES) ×6 IMPLANT
DRESSING AQUACEL AG SP 3.5X10 (GAUZE/BANDAGES/DRESSINGS) IMPLANT
DRSG AQUACEL AG SP 3.5X10 (GAUZE/BANDAGES/DRESSINGS) ×3
ELECT PENCIL ROCKER SW 15FT (MISCELLANEOUS) ×3 IMPLANT
ELECT REM PT RETURN 15FT ADLT (MISCELLANEOUS) ×6 IMPLANT
GAUZE SPONGE 4X4 12PLY STRL (GAUZE/BANDAGES/DRESSINGS) ×3 IMPLANT
GLOVE BIO SURGEON STRL SZ8.5 (GLOVE) ×6 IMPLANT
GLOVE BIOGEL PI IND STRL 7.5 (GLOVE) IMPLANT
GLOVE BIOGEL PI IND STRL 8.5 (GLOVE) ×1 IMPLANT
GLOVE BIOGEL PI INDICATOR 7.5 (GLOVE) ×6
GLOVE BIOGEL PI INDICATOR 8.5 (GLOVE) ×2
GLOVE ECLIPSE 7.5 STRL STRAW (GLOVE) ×4 IMPLANT
GLOVE ECLIPSE 8.5 STRL (GLOVE) ×2 IMPLANT
GLOVE ORTHO TXT STRL SZ7.5 (GLOVE) ×4 IMPLANT
GLOVE SS PI 9.0 STRL (GLOVE) ×2 IMPLANT
GLOVE SURG SS PI 7.5 STRL IVOR (GLOVE) ×2 IMPLANT
GOWN SPEC L3 XXLG W/TWL (GOWN DISPOSABLE) ×5 IMPLANT
GOWN STRL REUS W/ TWL XL LVL3 (GOWN DISPOSABLE) IMPLANT
GOWN STRL REUS W/TWL XL LVL3 (GOWN DISPOSABLE) ×5 IMPLANT
HANDPIECE INTERPULSE COAX TIP (DISPOSABLE) ×3
HOOD PEEL AWAY FLYTE STAYCOOL (MISCELLANEOUS) ×3 IMPLANT
MANIFOLD NEPTUNE II (INSTRUMENTS) ×3 IMPLANT
MARKER SKIN DUAL TIP RULER LAB (MISCELLANEOUS) ×3 IMPLANT
NDL SPNL 18GX3.5 QUINCKE PK (NEEDLE) ×1 IMPLANT
NEEDLE SPNL 18GX3.5 QUINCKE PK (NEEDLE) ×3 IMPLANT
PACK ANTERIOR HIP CUSTOM (KITS) ×3 IMPLANT
SAW OSC TIP CART 19.5X105X1.3 (SAW) ×3 IMPLANT
SEALER BIPOLAR AQUA 6.0 (INSTRUMENTS) ×2 IMPLANT
SET HNDPC FAN SPRY TIP SCT (DISPOSABLE) IMPLANT
SOL PREP POV-IOD 4OZ 10% (MISCELLANEOUS) ×3 IMPLANT
SUT ETHIBOND NAB CT1 #1 30IN (SUTURE) ×6 IMPLANT
SUT MNCRL AB 3-0 PS2 18 (SUTURE) ×3 IMPLANT
SUT MON AB 2-0 CT1 36 (SUTURE) ×6 IMPLANT
SUT VIC AB 2-0 CT1 27 (SUTURE) ×3
SUT VIC AB 2-0 CT1 TAPERPNT 27 (SUTURE) ×1 IMPLANT
SUT VLOC 180 0 24IN GS25 (SUTURE) ×3 IMPLANT
SYR 50ML LL SCALE MARK (SYRINGE) IMPLANT
TRAY FOLEY CATH 14FR (SET/KITS/TRAYS/PACK) ×2 IMPLANT

## 2017-07-03 NOTE — Transfer of Care (Signed)
Immediate Anesthesia Transfer of Care Note  Patient: Dawn Gordon  Procedure(s) Performed: ANTERIOR APPROACH HEMI HIP ARTHROPLASTY (Left Hip)  Patient Location: PACU  Anesthesia Type:General  Level of Consciousness: awake and patient cooperative  Airway & Oxygen Therapy: Patient Spontanous Breathing and Patient connected to face mask oxygen  Post-op Assessment: Report given to RN and Post -op Vital signs reviewed and stable  Post vital signs: Reviewed and stable  Last Vitals:  Vitals Value Taken Time  BP 175/76 07/03/2017  3:30 PM  Temp    Pulse 67 07/03/2017  3:34 PM  Resp 13 07/03/2017  3:34 PM  SpO2 100 % 07/03/2017  3:34 PM  Vitals shown include unvalidated device data.  Last Pain:  Vitals:   07/03/17 1218  TempSrc:   PainSc: Asleep      Patients Stated Pain Goal: 0 (07/03/17 1610)  Complications: No apparent anesthesia complications

## 2017-07-03 NOTE — ED Notes (Signed)
Report to [facility] on [address]

## 2017-07-03 NOTE — Op Note (Addendum)
OPERATIVE REPORT  SURGEON: Samson Frederic, MD   ASSISTANT: Alphonsa Overall, PA-C.  PREOPERATIVE DIAGNOSIS: Displaced Left femoral neck fracture.   POSTOPERATIVE DIAGNOSIS: Displaced Left femoral neck fracture.   PROCEDURE: Left hip hemiarthroplasty, anterior approach.   IMPLANTS: DePuy Tri Lock stem, size 4, std offset, with a -3 mm spacer and a 45 mm monopolar head ball.  ANESTHESIA:  General  ANTIBIOTICS: 2g ancef.  ESTIMATED BLOOD LOSS:-250 mL    DRAINS: None.  COMPLICATIONS: None   CONDITION: PACU - hemodynamically stable.   BRIEF CLINICAL NOTE: Dawn Gordon is a 84 y.o. female with a displaced Left femoral neck fracture. The patient was admitted to the hospitalist service and underwent perioperative risk stratification and medical optimization. The risks, benefits, and alternatives to hemiarthroplasty were explained, and the patient elected to proceed.  PROCEDURE IN DETAIL: The patient was taken to the operating room and general anesthesia was induced on the hospital bed. The patient was then positioned on the Hana table. All bony prominences were well padded. The hip was prepped and draped in the normal sterile surgical fashion. A time-out was called verifying side and site of surgery. Antibiotics were given within 60 minutes of beginning the procedure.  The direct anterior approach to the hip was performed through the Hueter interval. Lateral femoral circumflex vessels were treated with the Auqumantys. The anterior capsule was exposed and an inverted T capsulotomy was made. Fracture hematoma was encountered and evacuated. The patient was found to have a comminuted Left subcapital femoral neck fracture. I freshened the femoral neck cut with a saw. I removed the femoral neck fragment. A corkscrew was placed into the head and the head was removed. This was passed to the back table and was measured.  Acetabular exposure was achieved. I examined the articular  cartilage which was intact. The labrum was intact. A 45 mm trial head was placed and found to have excellent fit.  I then gained femoral exposure taking care to protect the abductors and greater trochanter. This was performed using standard external rotation, extension, and adduction. The capsule was peeled off the inner aspect of the greater trochanter, taking care to preserve the short external rotators. A cookie cutter was used to enter the femoral canal, and then the femoral canal finder was used to confirm location. I then sequentially broached up to a size 4. Calcar planer was used on the femoral neck remnant. I paced a std neck and a 36 + 1.5 head ball.The hip was reduced. Leg lengths were checked fluoroscopically. The hip was dislocated and trial components were removed. I placed the real stem followed by the real spacer and head ball. A single reduction maneuver was performed and the hip was reduced. Fluoroscopy was used to confirm component position and leg lengths. At 90 degrees of external rotation and extension, the hip was stable to an anterior directed force.  The wound was copiously irrigated with normal saline solution. Marcaine solution was injected into the periarticular soft tissue. The wound was closed in layers using #1 Vicryl and V-Loc for the fascia, 2-0 Vicryl for the subcutaneous fat, 2-0 Monocryl for the deep dermal layer, 3-0 running Monocryl subcuticular stitch and glue for the skin. Once the glue was fully dried, an Aquacell Ag dressing was applied. The patient was then awakened from anesthesia and transported to the recovery room in stable condition. Sponge, needle, and instrument counts were correct at the end of the case x2. The patient tolerated the procedure well and there  were no known complications.  Please note that a surgical assistant was a medical necessity for this procedure to perform it in a safe and expeditious manner. Assistant was necessary  to provide appropriate retraction of vital neurovascular structures, to prevent femoral fracture, and to allow for anatomic placement of the prosthesis.  Postoperatively, she may weight-bear as tolerated with a walker.  We will begin Lovenox for DVT prophylaxis.  She will work with physical and occupational therapy and undergo disposition planning.  Return to the office in 2 weeks for routine postoperative care.

## 2017-07-03 NOTE — Consult Note (Signed)
ORTHOPAEDIC CONSULTATION  REQUESTING PHYSICIAN: Briant Cedar, MD  PCP:  Brooke Bonito, MD  Chief Complaint: L hip pain  HPI: Dawn Gordon is a 84 y.o. female who complains of L hip pain. Patient has dementia and cannot recall an injury. History obtained from chart. She resides at an ALF and normally uses a walker. Reportedly, 3 days ago she had a normal hip x-ray at doctors office; however, she saw the doctor again yesterday who again ordered a repeat x-ray today which now shows a left femoral neck fracture not seen 3 days ago.  Patient was therefore sent to ED at [facility]. She was transferred to [facility]. Orthopaedic consultation was placed for management of left femoral neck fracture.   Past Medical History:  Diagnosis Date  . Amnesia   . Hyperlipidemia   . Overactive bladder   . Sleep apnea    Past Surgical History:  Procedure Laterality Date  . ANKLE SURGERY    . BACK SURGERY    . KNEE SURGERY     Social History   Socioeconomic History  . Marital status: Widowed    Spouse name: Not on file  . Number of children: Not on file  . Years of education: Not on file  . Highest education level: Not on file  Occupational History  . Not on file  Social Needs  . Financial resource strain: Not on file  . Food insecurity:    Worry: Not on file    Inability: Not on file  . Transportation needs:    Medical: Not on file    Non-medical: Not on file  Tobacco Use  . Smoking status: Never Smoker  . Smokeless tobacco: Never Used  Substance and Sexual Activity  . Alcohol use: No  . Drug use: Not on file  . Sexual activity: Not on file  Lifestyle  . Physical activity:    Days per week: Not on file    Minutes per session: Not on file  . Stress: Not on file  Relationships  . Social connections:    Talks on phone: Not on file    Gets together: Not on file    Attends religious service: Not on file    Active member of club or organization: Not on file    Attends meetings  of clubs or organizations: Not on file    Relationship status: Not on file  Other Topics Concern  . Not on file  Social History Narrative  . Not on file   History reviewed. No pertinent family history. No Known Allergies Prior to Admission medications   Medication Sig Start Date End Date Taking? Authorizing Provider  acetaminophen (TYLENOL) 500 MG tablet Take 1,000 mg by mouth every 8 (eight) hours as needed for mild pain.   Yes [provider]  buPROPion (WELLBUTRIN) 75 MG tablet Take 75 mg by mouth 2 (two) times daily. 06/25/16  Yes [provider]  Cyanocobalamin (VITAMIN B-12 PO) Take 1 tablet by mouth daily.   Yes [provider]  donepezil (ARICEPT) 10 MG tablet Take 10 mg by mouth at bedtime.   Yes [provider]  escitalopram (LEXAPRO) 10 MG tablet Take 10 mg by mouth daily.   Yes [provider]  meclizine (ANTIVERT) 12.5 MG tablet Take 12.5 mg by mouth 3 (three) times daily as needed for dizziness.   Yes [provider]  memantine (NAMENDA) 10 MG tablet Take 10 mg by mouth 2 (two) times daily.  Yes [provider]  Multiple Vitamins-Minerals (PRESERVISION AREDS PO) Take 1 tablet by mouth daily.   Yes [provider]  polyvinyl alcohol (LIQUIFILM TEARS) 1.4 % ophthalmic solution Place 1 drop into both eyes 3 (three) times daily as needed for dry eyes.   Yes [provider]  simvastatin (ZOCOR) 40 MG tablet Take 40 mg by mouth daily.   Yes [provider]  traMADol (ULTRAM) 50 MG tablet Take 25 mg by mouth every 4 (four) hours as needed for moderate pain.   Yes [provider]  traZODone (DESYREL) 50 MG tablet Take 50 mg by mouth at bedtime.   Yes [provider]  triamcinolone cream (KENALOG) 0.1 % Apply 1 application topically as directed. Every 24 hours as needed for itching   Yes [provider]   Dg Chest 1 View  Result Date: 07/02/2017 CLINICAL DATA:  Hip  fracture EXAM: CHEST  1 VIEW COMPARISON:  06/13/2016 FINDINGS: Mildly low lung volumes. Mild diffuse coarse interstitial opacity felt consistent with chronic change. No acute consolidation. Mild cardiomegaly. Aortic atherosclerosis. No pneumothorax. IMPRESSION: No active disease.  Borderline to mild cardiomegaly. Electronically Signed   By: Jasmine Pang M.D.   On: 07/02/2017 23:24   Dg Knee 1-2 Views Left  Result Date: 07/03/2017 CLINICAL DATA:  84 year old female with left hip fracture. Prior left knee replacement. EXAM: LEFT KNEE - 1-2 VIEW COMPARISON:  Left hip radiograph dated 07/02/2017 FINDINGS: There is a total knee arthroplasty. The arthroplasty components appear intact and in anatomic alignment. No evidence of loosening. The bones are osteopenic. There is no acute fracture or dislocation. No joint effusion. The soft tissues appear unremarkable. IMPRESSION: 1. No acute fracture or dislocation. 2. Osteopenia. 3. Total knee arthroplasty appears intact. Electronically Signed   By: Elgie Collard M.D.   On: 07/03/2017 00:49   Dg Hip Unilat W Or Wo Pelvis 2-3 Views Left  Result Date: 07/02/2017 CLINICAL DATA:  Unwitnessed fall EXAM: DG HIP (WITH OR WITHOUT PELVIS) 2-3V LEFT COMPARISON:  None. FINDINGS: Mild SI joint degenerative changes. Pubic symphysis and rami are intact. Both femoral heads project in joint. Acute left femoral neck fracture with cephalad migration of the trochanter. Left femoral head projects in joint. Multiple phleboliths in the pelvis. IMPRESSION: Acute displaced left femoral neck fracture Electronically Signed   By: Jasmine Pang M.D.   On: 07/02/2017 23:23    Positive ROS: All other systems have been reviewed and were otherwise negative with the exception of those mentioned in the HPI and as above.  Physical Exam: General: Alert, no acute distress Cardiovascular: No pedal edema Respiratory: No cyanosis, no use of accessory musculature GI: No organomegaly, abdomen is soft  and non-tender Skin: No lesions in the area of chief complaint Neurologic: Sensation intact distally Psychiatric: Patient is competent for consent with normal mood and affect Lymphatic: No axillary or cervical lymphadenopathy  MUSCULOSKELETAL: Examination of the left hip reveals no skin wounds or lesions. She is shortened and externally rotated. Motor and sensory exam limited by MS. Foot is perfused.  Assessment: Displaced L femoral neck fx dementia  Plan: I discussed the situation with the patient and with her son, Jorja Loa, over the phone. She has a displaced femoral neck fracture and would benefit from left hip hemiarthroplasty for pain control and early mobilization. We discussed the risks, benefits, and alternatives. We discussed that her dementia could worsen. Plan for surgery today.  The risks, benefits, and alternatives were discussed with the patient. There  are risks associated with the surgery including, but not limited to, problems with anesthesia (death), infection, instability (giving out of the joint), dislocation, differences in leg length/angulation/rotation, fracture of bones, loosening or failure of implants, hematoma (blood accumulation) which may require surgical drainage, blood clots, pulmonary embolism, nerve injury (foot drop and lateral thigh numbness), and blood vessel injury. The patient understands these risks and elects to proceed.   Jonette Pesa, MD Cell [phone]    07/03/2017 9:25 AM

## 2017-07-03 NOTE — Progress Notes (Signed)
Initial Nutrition Assessment  DOCUMENTATION CODES:   Obesity unspecified  INTERVENTION:   Ensure Enlive po BID, each supplement provides 350 kcal and 20 grams of protein  NUTRITION DIAGNOSIS:   Increased nutrient needs related to post-op healing as evidenced by estimated needs.  GOAL:   Patient will meet greater than or equal to 90% of their needs  MONITOR:   PO intake, Diet advancement, Weight trends, Supplement acceptance, Labs  REASON FOR ASSESSMENT:   Consult Hip fracture protocol  ASSESSMENT:   Patient with PMH significant for Alzheimer's dementia and HLD. Presents this admission with a left hip fracture. Plan for repair 5/3.    Spoke with son at bedside. Pt comes from a facility and he is unsure of her day to day intake. He states her appetite is typically off and on but it started to decline from baseline Thursday. Pt does not consume protein supplements at the facility. He denies pt has any issues with swallowing. Discussed the importance of protein intake for post op healing. Son amendable to Ensure this stay. Pt is currently NPO for procedure.   Ann Maki denies pt had any recent wt loss. Records are limited in weight history and only show stated weights. Will track weight trends this admission. Nutrition-Focused physical exam completed.  Medications reviewed and include: prosight MVI Labs reviewed: K 3.3 (L)   NUTRITION - FOCUSED PHYSICAL EXAM:    Most Recent Value  Orbital Region  No depletion  Upper Arm Region  No depletion  Thoracic and Lumbar Region  Unable to assess  Buccal Region  Moderate depletion  Temple Region  Mild depletion  Clavicle Bone Region  No depletion  Clavicle and Acromion Bone Region  No depletion  Scapular Bone Region  Unable to assess  Dorsal Hand  Moderate depletion  Patellar Region  No depletion  Anterior Thigh Region  No depletion  Posterior Calf Region  No depletion  Edema (RD Assessment)  Unable to assess     Diet Order:    Diet Order           Diet NPO time specified  Diet effective now          EDUCATION NEEDS:   Education needs have been addressed  Skin:  Skin Assessment: Reviewed RN Assessment  Last BM:  PTA  Height:   Ht Readings from Last 1 Encounters:  07/03/17 5' (1.524 m)    Weight:   Wt Readings from Last 1 Encounters:  07/03/17 160 lb (72.6 kg)    Ideal Body Weight:  45.5 kg  BMI:  Body mass index is 31.25 kg/m.  Estimated Nutritional Needs:   Kcal:  1450-1650 kcal  Protein:  70-80 g  Fluid:  >1.4 L/day    Vanessa Kick RD, LDN Clinical Nutrition Pager # [phone]

## 2017-07-03 NOTE — Progress Notes (Signed)
Glasses and dentures at bedside.

## 2017-07-03 NOTE — Anesthesia Postprocedure Evaluation (Signed)
Anesthesia Post Note  Patient: Dawn Gordon  Procedure(s) Performed: ANTERIOR APPROACH HEMI HIP ARTHROPLASTY (Left Hip)     Patient location during evaluation: PACU Anesthesia Type: General Level of consciousness: awake and alert Pain management: pain level controlled Vital Signs Assessment: post-procedure vital signs reviewed and stable Respiratory status: spontaneous breathing, nonlabored ventilation, respiratory function stable and patient connected to nasal cannula oxygen Cardiovascular status: blood pressure returned to baseline and stable Postop Assessment: no apparent nausea or vomiting Anesthetic complications: no    Last Vitals:  Vitals:   07/03/17 1545 07/03/17 1600  BP: (!) 163/82   Pulse: 68 70  Resp: 13 12  Temp:    SpO2: 100% 100%    Last Pain:  Vitals:   07/03/17 1218  TempSrc:   PainSc: Asleep                 Trevor Iha

## 2017-07-03 NOTE — Discharge Instructions (Signed)
°Dr. Clarnce Homan °Joint Replacement Specialist °[facility] °[address] °[place], [place] [address] °[phone] ° ° °TOTAL HIP REPLACEMENT POSTOPERATIVE DIRECTIONS ° ° ° °Hip Rehabilitation, Guidelines Following Surgery  ° °WEIGHT BEARING °Weight bearing as tolerated with assist device (walker, cane, etc) as directed, use it as long as suggested by your surgeon or therapist, typically at least 4-6 weeks. ° °The results of a hip operation are greatly improved after range of motion and muscle strengthening exercises. Follow all safety measures which are given to protect your hip. If any of these exercises cause increased pain or swelling in your joint, decrease the amount until you are comfortable again. Then slowly increase the exercises. Call your caregiver if you have problems or questions.  ° °HOME CARE INSTRUCTIONS  °Most of the following instructions are designed to prevent the dislocation of your new hip.  °Remove items at home which could result in a fall. This includes throw rugs or furniture in walking pathways.  °Continue medications as instructed at time of discharge. °· You may have some home medications which will be placed on hold until you complete the course of blood thinner medication. °· You may start showering once you are discharged home. Do not remove your dressing. °Do not put on socks or shoes without following the instructions of your caregivers.   °Sit on chairs with arms. Use the chair arms to help push yourself up when arising.  °Arrange for the use of a toilet seat elevator so you are not sitting low.  °· Walk with walker as instructed.  °You may resume a sexual relationship in one month or when given the OK by your caregiver.  °Use walker as long as suggested by your caregivers.  °You may put full weight on your legs and walk as much as is comfortable. °Avoid periods of inactivity such as sitting longer than an hour when not asleep. This helps prevent  blood clots.  °You may return to work once you are cleared by your surgeon.  °Do not drive a car for 6 weeks or until released by your surgeon.  °Do not drive while taking narcotics.  °Wear elastic stockings for two weeks following surgery during the day but you may remove then at night.  °Make sure you keep all of your appointments after your operation with all of your doctors and caregivers. You should call the office at the above phone number and make an appointment for approximately two weeks after the date of your surgery. °Please pick up a stool softener and laxative for home use as long as you are requiring pain medications. °· ICE to the affected hip every three hours for 30 minutes at a time and then as needed for pain and swelling. Continue to use ice on the hip for pain and swelling from surgery. You may notice swelling that will progress down to the foot and ankle.  This is normal after surgery.  Elevate the leg when you are not up walking on it.   °It is important for you to complete the blood thinner medication as prescribed by your doctor. °· Continue to use the breathing machine which will help keep your temperature down.  It is common for your temperature to cycle up and down following surgery, especially at night when you are not up moving around and exerting yourself.  The breathing machine keeps your lungs expanded and your temperature down. ° °RANGE OF MOTION AND STRENGTHENING EXERCISES  °These exercises are   designed to help you keep full movement of your hip joint. Follow your caregiver's or physical therapist's instructions. Perform all exercises about fifteen times, three times per day or as directed. Exercise both hips, even if you have had only one joint replacement. These exercises can be done on a training (exercise) mat, on the floor, on a table or on a bed. Use whatever works the best and is most comfortable for you. Use music or television while you are exercising so that the exercises  are a pleasant break in your day. This will make your life better with the exercises acting as a break in routine you can look forward to.  °Lying on your back, slowly slide your foot toward your buttocks, raising your knee up off the floor. Then slowly slide your foot back down until your leg is straight again.  °Lying on your back spread your legs as far apart as you can without causing discomfort.  °Lying on your side, raise your upper leg and foot straight up from the floor as far as is comfortable. Slowly lower the leg and repeat.  °Lying on your back, tighten up the muscle in the front of your thigh (quadriceps muscles). You can do this by keeping your leg straight and trying to raise your heel off the floor. This helps strengthen the largest muscle supporting your knee.  °Lying on your back, tighten up the muscles of your buttocks both with the legs straight and with the knee bent at a comfortable angle while keeping your heel on the floor.  ° °SKILLED REHAB INSTRUCTIONS: °If the patient is transferred to a skilled rehab facility following release from the hospital, a list of the current medications will be sent to the facility for the patient to continue.  When discharged from the skilled rehab facility, please have the facility set up the patient's Home Health Physical Therapy prior to being released. Also, the skilled facility will be responsible for providing the patient with their medications at time of release from the facility to include their pain medication and their blood thinner medication. If the patient is still at the rehab facility at time of the two week follow up appointment, the skilled rehab facility will also need to assist the patient in arranging follow up appointment in our office and any transportation needs. ° °MAKE SURE YOU:  °Understand these instructions.  °Will watch your condition.  °Will get help right away if you are not doing well or get worse. ° °Pick up stool softner and  laxative for home use following surgery while on pain medications. °Do not remove your dressing. °The dressing is waterproof--it is OK to take showers. °Continue to use ice for pain and swelling after surgery. °Do not use any lotions or creams on the incision until instructed by your surgeon. °Total Hip Protocol. ° ° °

## 2017-07-03 NOTE — Progress Notes (Signed)
PROGRESS NOTE  Rylee Huestis Rossmann AOZ:[id number] DOB: 10/27/1920 DOA: 07/02/2017 PCP: Brooke Bonito, MD  HPI/Recap of past 24 hours: Eliot Ford is a 84 y.o. female with medical history significant of Alzheimer's dementia and hyperlipidemia who presents with left hip pain. Patient is accompanied by family who reports that she has had some left hip pain for the last few days. X-ray showed a left femoral neck fracture.  Family unaware if patient's ever fell/trauma.  Patient has baseline dementia and unable to give details.  Admitted for further management with orthopedics on consult.  Today, patient was noted to be somewhat uncomfortable, noted left hip tenderness.  Patient denies any new complaints, no chest pain, denies abdominal pain, denies fever/chills.  Patient for surgery today.   Assessment/Plan: Principal Problem:   Closed left hip fracture, initial encounter [facility]) Active Problems:   Alzheimer's dementia without behavioral disturbance   Displaced fracture of left femoral neck (HCC)  Acute displaced left femoral neck fracture Status post left hip hemiarthroplasty on 07/03/2017 Unknown etiology as patient has dementia, likely mechanical fall Pain management Orthopedics on board: DVT ppx, PT/OT Fall precautions Monitor closely  Dementia Continue Aricept and Namenda     Code Status: Full  Family Communication: None at bedside  Disposition Plan: SNF   Consultants:  Orthopedics  Procedures:  Left hip hemiarthroplasty on 07/03/2017  Antimicrobials:  None  DVT prophylaxis: SCDs   Objective: Vitals:   07/03/17 0256 07/03/17 1530 07/03/17 1545 07/03/17 1600  BP: (!) 172/99 (!) 175/76 (!) 163/82   Pulse: 83 65 68 70  Resp: Temp: 98 F (36.7 C) 98.2 F (36.8 C)    TempSrc: Oral     SpO2: 96% 100% 100% 100%  Weight: 72.6 kg (160 lb)     Height: 5' (1.524 m)       Intake/Output Summary (Last 24 hours) at 07/03/2017 1607 Last data filed at  07/03/2017 1534 Gross per 24 hour  Intake 2360 ml  Output 250 ml  Net 2110 ml   Filed Weights   07/02/17 2108 07/02/17 2225 07/03/17 0256  Weight: 68 kg (150 lb) 68 kg (150 lb) 72.6 kg (160 lb)    Exam:   General: NAD  Cardiovascular: S1, S2 present  Respiratory: CTA B  Abdomen: Soft, nontender, nondistended, bowel sounds present  Musculoskeletal: No pedal edema bilaterally, left hip tenderness  Skin: Normal  Psychiatry: Unable to assess   Data Reviewed: CBC: Recent Labs  Lab 07/02/17 2325  WBC 10.6*  NEUTROABS 7.9  HGB 13.7  HCT 41.7  MCV 94.1  PLT PLATELET CLUMPS NOTED ON SMEAR, COUNT APPEARS ADEQUATE   Basic Metabolic Panel: Recent Labs  Lab 07/02/17 2325  NA 138  K 3.3*  CL 105  CO2 22  GLUCOSE 95  BUN 17  CREATININE 0.62  CALCIUM 8.6*   GFR: Estimated Creatinine Clearance: 38.2 mL/min (by C-G formula based on SCr of 0.62 mg/dL). Liver Function Tests: Recent Labs  Lab 07/02/17 2325  AST 16  ALT 11*  ALKPHOS 136*  BILITOT 1.0  PROT 6.8  ALBUMIN 3.5   No results for input(s): LIPASE, AMYLASE in the last 168 hours. No results for input(s): AMMONIA in the last 168 hours. Coagulation Profile: Recent Labs  Lab 07/02/17 2240  INR 1.51   Cardiac Enzymes: No results for input(s): CKTOTAL, CKMB, CKMBINDEX, TROPONINI in the last 168 hours. BNP (last 3 results) No results for input(s): PROBNP in the last 8760 hours. HbA1C:  No results for input(s): HGBA1C in the last 72 hours. CBG: No results for input(s): GLUCAP in the last 168 hours. Lipid Profile: No results for input(s): CHOL, HDL, LDLCALC, TRIG, CHOLHDL, LDLDIRECT in the last 72 hours. Thyroid Function Tests: No results for input(s): TSH, T4TOTAL, FREET4, T3FREE, THYROIDAB in the last 72 hours. Anemia Panel: No results for input(s): VITAMINB12, FOLATE, FERRITIN, TIBC, IRON, RETICCTPCT in the last 72 hours. Urine analysis: No results found for: COLORURINE, APPEARANCEUR, LABSPEC,  PHURINE, GLUCOSEU, HGBUR, BILIRUBINUR, KETONESUR, PROTEINUR, UROBILINOGEN, NITRITE, LEUKOCYTESUR Sepsis Labs: (procalcitonin:4,lacticidven:4)  ) Recent Results (from the past 240 hour(s))  MRSA PCR Screening     Status: None   Collection Time: 07/03/17  3:31 AM  Result Value Ref Range Status   MRSA by PCR NEGATIVE NEGATIVE Final    Comment:        The GeneXpert MRSA Assay (FDA approved for NASAL specimens only), is one component of a comprehensive MRSA colonization surveillance program. It is not intended to diagnose MRSA infection nor to guide or monitor treatment for MRSA infections. Performed at [facility], [address] [place], [place] [address]       Studies: Dg Chest 1 View  Result Date: 07/02/2017 CLINICAL DATA:  Hip fracture EXAM: CHEST  1 VIEW COMPARISON:  06/13/2016 FINDINGS: Mildly low lung volumes. Mild diffuse coarse interstitial opacity felt consistent with chronic change. No acute consolidation. Mild cardiomegaly. Aortic atherosclerosis. No pneumothorax. IMPRESSION: No active disease.  Borderline to mild cardiomegaly. Electronically Signed   By: Jasmine Pang M.D.   On: 07/02/2017 23:24   Dg Knee 1-2 Views Left  Result Date: 07/03/2017 CLINICAL DATA:  84 year old female with left hip fracture. Prior left knee replacement. EXAM: LEFT KNEE - 1-2 VIEW COMPARISON:  Left hip radiograph dated 07/02/2017 FINDINGS: There is a total knee arthroplasty. The arthroplasty components appear intact and in anatomic alignment. No evidence of loosening. The bones are osteopenic. There is no acute fracture or dislocation. No joint effusion. The soft tissues appear unremarkable. IMPRESSION: 1. No acute fracture or dislocation. 2. Osteopenia. 3. Total knee arthroplasty appears intact. Electronically Signed   By: Elgie Collard M.D.   On: 07/03/2017 00:49   Dg C-arm 1-60 Min-no Report  Result Date: 07/03/2017 Fluoroscopy was utilized by the requesting  physician.  No radiographic interpretation.   Dg Hip Operative Unilat With Pelvis Left  Result Date: 07/03/2017 CLINICAL DATA:  Left hip replacement. EXAM: OPERATIVE LEFT HIP (WITH PELVIS IF PERFORMED) TECHNIQUE: Fluoroscopic spot image(s) were submitted for interpretation post-operatively. FLUOROSCOPY TIME:  9 seconds. C-arm fluoroscopic images were obtained intraoperatively and submitted for post operative interpretation. COMPARISON:  Left hip x-rays from yesterday. FINDINGS: Intraoperative x-rays demonstrate interval left hip hemiarthroplasty. Alignment is normal. No acute abnormality. IMPRESSION: Interval left hip hemiarthroplasty. Electronically Signed   By: Obie Dredge M.D.   On: 07/03/2017 15:39   Dg Hip Unilat W Or Wo Pelvis 2-3 Views Left  Result Date: 07/02/2017 CLINICAL DATA:  Unwitnessed fall EXAM: DG HIP (WITH OR WITHOUT PELVIS) 2-3V LEFT COMPARISON:  None. FINDINGS: Mild SI joint degenerative changes. Pubic symphysis and rami are intact. Both femoral heads project in joint. Acute left femoral neck fracture with cephalad migration of the trochanter. Left femoral head projects in joint. Multiple phleboliths in the pelvis. IMPRESSION: Acute displaced left femoral neck fracture Electronically Signed   By: Jasmine Pang M.D.   On: 07/02/2017 23:23    Scheduled Meds: . [MAR Hold] buPROPion  75 mg Oral BID  . [  MAR Hold] donepezil  10 mg Oral QHS  . [MAR Hold] escitalopram  10 mg Oral Daily  . [MAR Hold] feeding supplement (ENSURE ENLIVE)  237 mL Oral BID BM  . [MAR Hold] memantine  10 mg Oral BID  . [MAR Hold] multivitamin   Oral Daily  . [MAR Hold] simvastatin  40 mg Oral q1800  . [MAR Hold] traZODone  50 mg Oral QHS    Continuous Infusions: . sodium chloride    . acetaminophen    . ceFAZolin       LOS: 0 days     Briant Cedar, MD [facility]   If 7PM-7AM, please contact night-coverage [email or web address] Password [email or web address] 07/03/2017, 4:07 PM

## 2017-07-03 NOTE — Anesthesia Preprocedure Evaluation (Addendum)
Anesthesia Evaluation  Patient identified by MRN, date of birth, ID band Patient awake    Reviewed: Allergy & Precautions, NPO status , Patient's Chart, lab work & pertinent test results  Airway Mallampati: II  TM Distance: >3 FB Neck ROM: Full    Dental no notable dental hx.    Pulmonary neg pulmonary ROS,    Pulmonary exam normal breath sounds clear to auscultation       Cardiovascular Exercise Tolerance: Good negative cardio ROS Normal cardiovascular exam Rhythm:Regular Rate:Normal     Neuro/Psych Dementia    GI/Hepatic negative GI ROS, Neg liver ROS,   Endo/Other  negative endocrine ROS  Renal/GU negative Renal ROS     Musculoskeletal negative musculoskeletal ROS (+)   Abdominal   Peds  Hematology negative hematology ROS (+)   Anesthesia Other Findings   Reproductive/Obstetrics                            Lab Results  Component Value Date   CREATININE 0.62 07/02/2017   BUN 17 07/02/2017   NA 138 07/02/2017   K 3.3 (L) 07/02/2017   CL 105 07/02/2017   CO2 22 07/02/2017    Lab Results  Component Value Date   WBC 10.6 (H) 07/02/2017   HGB 13.7 07/02/2017   HCT 41.7 07/02/2017   MCV 94.1 07/02/2017   PLT  07/02/2017    PLATELET CLUMPS NOTED ON SMEAR, COUNT APPEARS ADEQUATE    Anesthesia Physical Anesthesia Plan  ASA: III  Anesthesia Plan: General   Post-op Pain Management:    Induction: Intravenous  PONV Risk Score and Plan: 3 and Treatment may vary due to age or medical condition  Airway Management Planned: Oral ETT  Additional Equipment:   Intra-op Plan:   Post-operative Plan: Extubation in OR  Informed Consent: I have reviewed the patients History and Physical, chart, labs and discussed the procedure including the risks, benefits and alternatives for the proposed anesthesia with the patient or authorized representative who has indicated his/her understanding  and acceptance.   Dental advisory given  Plan Discussed with: CRNA  Anesthesia Plan Comments:        Anesthesia Quick Evaluation

## 2017-07-03 NOTE — H&P (Signed)
History and Physical    Dawn Gordon:[id number] DOB: [birth year] DOA: 07/02/2017  PCP: Brooke Bonito, MD  Patient coming from: Dawn Gordon  I have personally briefly reviewed patient's old medical records in [facility]  Chief Complaint: L hip pain  HPI: Dawn Gordon is a 84 y.o. female with medical history significant of Alzheimer's dementia and hyperlipidemia who presents with left hip pain.  Patient is accompanied by family who reports that she has had some left hip pain for the last few days.  Reportedly, 3 days ago she had a normal hip x-ray at doctors office; however, she saw the doctor again today who again ordered a repeat x-ray today which now shows a left femoral neck fracture not seen 3 days ago.  Patient was therefore sent to ED at [facility].  Patient, likely due to her advanced baseline dementia, is unable to describe any fall and the staff at the facility did not tell the son about any recent trauma.  Patient otherwise is only complaining of pain in the left hip and does not have any other evidence of trauma.   she denies any headaches, neck pain, chest pain, or back pain.  No abdominal pain.   ED Course: X ray confirms fracture.   Review of Systems: As per HPI otherwise 10 point review of systems negative.   Past Medical History:  Diagnosis Date  . Amnesia   . Hyperlipidemia   . Overactive bladder   . Sleep apnea     Past Surgical History:  Procedure Laterality Date  . ANKLE SURGERY    . BACK SURGERY    . KNEE SURGERY       reports that she has never smoked. She has never used smokeless tobacco. She reports that she does not drink alcohol. Her drug history is not on file.  No Known Allergies  History reviewed. No pertinent family history.   Prior to Admission medications   Medication Sig Start Date End Date Taking? Authorizing Provider  acetaminophen (TYLENOL) 500 MG tablet Take 1,000 mg by mouth every 8 (eight) hours as needed for mild pain.    Yes [provider]  buPROPion (WELLBUTRIN) 75 MG tablet Take 75 mg by mouth 2 (two) times daily. 06/25/16  Yes [provider]  Cyanocobalamin (VITAMIN B-12 PO) Take 1 tablet by mouth daily.   Yes [provider]  donepezil (ARICEPT) 10 MG tablet Take 10 mg by mouth at bedtime.   Yes [provider]  escitalopram (LEXAPRO) 10 MG tablet Take 10 mg by mouth daily.   Yes [provider]  meclizine (ANTIVERT) 12.5 MG tablet Take 12.5 mg by mouth 3 (three) times daily as needed for dizziness.   Yes [provider]  memantine (NAMENDA) 10 MG tablet Take 10 mg by mouth 2 (two) times daily.   Yes [provider]  Multiple Vitamins-Minerals (PRESERVISION AREDS PO) Take 1 tablet by mouth daily.   Yes [provider]  polyvinyl alcohol (LIQUIFILM TEARS) 1.4 % ophthalmic solution Place 1 drop into both eyes 3 (three) times daily as needed for dry eyes.   Yes [provider]  simvastatin (ZOCOR) 40 MG tablet Take 40 mg by mouth daily.   Yes [provider]  traMADol (ULTRAM) 50 MG tablet Take 25 mg by mouth every 4 (four) hours as needed for moderate pain.   Yes [provider]  traZODone (DESYREL) 50 MG tablet Take 50 mg by mouth at bedtime.  Yes [provider]  triamcinolone cream (KENALOG) 0.1 % Apply 1 application topically as directed. Every 24 hours as needed for itching   Yes [provider]    Physical Exam: Vitals:   07/02/17 2225 07/02/17 2344 07/03/17 0000 07/03/17 0256  BP:  (!) 144/86 136/79 (!) 172/99  Pulse:  90 86 83  Resp:  Temp:    98 F (36.7 C)  TempSrc:    Oral  SpO2:  96% 95% 96%  Weight: 68 kg (150 lb)       Constitutional: NAD, calm, comfortable Eyes: PERRL, lids and conjunctivae normal ENMT: Mucous membranes are moist. Posterior pharynx clear of any exudate or lesions.Normal dentition.  Neck: normal, supple, no masses, no thyromegaly Respiratory:  clear to auscultation bilaterally, no wheezing, no crackles. Normal respiratory effort. No accessory muscle use.  Cardiovascular: Regular rate and rhythm, no murmurs / rubs / gallops. No extremity edema. 2+ pedal pulses. No carotid bruits.  Abdomen: no tenderness, no masses palpated. No hepatosplenomegaly. Bowel sounds positive.  Musculoskeletal: L hip tenderness Skin: no rashes, lesions, ulcers. No induration Neurologic: CN 2-12 grossly intact. Sensation intact, DTR normal. Strength 5/5 in all 4.  Psychiatric: Poor short term recall   Labs on Admission: I have personally reviewed following labs and imaging studies  CBC: Recent Labs  Lab 07/02/17 2325  WBC 10.6*  NEUTROABS 7.9  HGB 13.7  HCT 41.7  MCV 94.1  PLT PLATELET CLUMPS NOTED ON SMEAR, COUNT APPEARS ADEQUATE   Basic Metabolic Panel: Recent Labs  Lab 07/02/17 2325  NA 138  K 3.3*  CL 105  CO2 22  GLUCOSE 95  BUN 17  CREATININE 0.62  CALCIUM 8.6*   GFR: CrCl cannot be calculated (Unknown ideal weight.). Liver Function Tests: Recent Labs  Lab 07/02/17 2325  AST 16  ALT 11*  ALKPHOS 136*  BILITOT 1.0  PROT 6.8  ALBUMIN 3.5   No results for input(s): LIPASE, AMYLASE in the last 168 hours. No results for input(s): AMMONIA in the last 168 hours. Coagulation Profile: Recent Labs  Lab 07/02/17 2240  INR 1.51   Cardiac Enzymes: No results for input(s): CKTOTAL, CKMB, CKMBINDEX, TROPONINI in the last 168 hours. BNP (last 3 results) No results for input(s): PROBNP in the last 8760 hours. HbA1C: No results for input(s): HGBA1C in the last 72 hours. CBG: No results for input(s): GLUCAP in the last 168 hours. Lipid Profile: No results for input(s): CHOL, HDL, LDLCALC, TRIG, CHOLHDL, LDLDIRECT in the last 72 hours. Thyroid Function Tests: No results for input(s): TSH, T4TOTAL, FREET4, T3FREE, THYROIDAB in the last 72 hours. Anemia Panel: No results for input(s): VITAMINB12, FOLATE, FERRITIN, TIBC, IRON,  RETICCTPCT in the last 72 hours. Urine analysis: No results found for: COLORURINE, APPEARANCEUR, LABSPEC, PHURINE, GLUCOSEU, HGBUR, BILIRUBINUR, KETONESUR, PROTEINUR, UROBILINOGEN, NITRITE, LEUKOCYTESUR  Radiological Exams on Admission: Dg Chest 1 View  Result Date: 07/02/2017 CLINICAL DATA:  Hip fracture EXAM: CHEST  1 VIEW COMPARISON:  06/13/2016 FINDINGS: Mildly low lung volumes. Mild diffuse coarse interstitial opacity felt consistent with chronic change. No acute consolidation. Mild cardiomegaly. Aortic atherosclerosis. No pneumothorax. IMPRESSION: No active disease.  Borderline to mild cardiomegaly. Electronically Signed   By: Jasmine Pang M.D.   On: 07/02/2017 23:24   Dg Knee 1-2 Views Left  Result Date: 07/03/2017 CLINICAL DATA:  84 year old female with left hip fracture. Prior left knee replacement. EXAM: LEFT KNEE - 1-2 VIEW COMPARISON:  Left hip radiograph dated 07/02/2017 FINDINGS: There  is a total knee arthroplasty. The arthroplasty components appear intact and in anatomic alignment. No evidence of loosening. The bones are osteopenic. There is no acute fracture or dislocation. No joint effusion. The soft tissues appear unremarkable. IMPRESSION: 1. No acute fracture or dislocation. 2. Osteopenia. 3. Total knee arthroplasty appears intact. Electronically Signed   By: Elgie Collard M.D.   On: 07/03/2017 00:49   Dg Hip Unilat W Or Wo Pelvis 2-3 Views Left  Result Date: 07/02/2017 CLINICAL DATA:  Unwitnessed fall EXAM: DG HIP (WITH OR WITHOUT PELVIS) 2-3V LEFT COMPARISON:  None. FINDINGS: Mild SI joint degenerative changes. Pubic symphysis and rami are intact. Both femoral heads project in joint. Acute left femoral neck fracture with cephalad migration of the trochanter. Left femoral head projects in joint. Multiple phleboliths in the pelvis. IMPRESSION: Acute displaced left femoral neck fracture Electronically Signed   By: Jasmine Pang M.D.   On: 07/02/2017 23:23    EKG: Independently  reviewed.  Assessment/Plan Principal Problem:   Closed left hip fracture, initial encounter [facility]) Active Problems:   Alzheimer's dementia without behavioral disturbance    1. L femoral neck fx - 1. Hip fx pathway 2. Pain control per pathway 3. Dr. Linna Caprice consulted: 1. NPO 2. No anticoagulants for now 4. Surgery planned for 12:30 today. 2. Alzheimer's dementia - 1. Continue home meds  DVT prophylaxis: SCDs Code Status: Full code for now - per family Family Communication: Family at bedside Disposition Plan: TBD Consults called: Dr. Linna Caprice Admission status: Admit to inpatient   Hillary Bow. DO [facility] Pager [phone]  If 7AM-7PM, please contact day team taking care of patient [email or web address] Password [facility]  07/03/2017, 3:46 AM

## 2017-07-03 NOTE — Anesthesia Procedure Notes (Signed)

## 2017-07-03 NOTE — ED Notes (Signed)
[facility] arrived to transport pt to [facility] 

## 2017-07-04 DIAGNOSIS — S72002A Fracture of unspecified part of neck of left femur, initial encounter for closed fracture: Secondary | ICD-10-CM

## 2017-07-04 DIAGNOSIS — G309 Alzheimer's disease, unspecified: Secondary | ICD-10-CM

## 2017-07-04 DIAGNOSIS — F0280 Dementia in other diseases classified elsewhere without behavioral disturbance: Secondary | ICD-10-CM

## 2017-07-04 LAB — CBC WITH DIFFERENTIAL/PLATELET
BASOS ABS: 0 10*3/uL (ref 0.0–0.1)
BASOS PCT: 0 %
EOS ABS: 0 10*3/uL (ref 0.0–0.7)
Eosinophils Relative: 0 %
HCT: 36.5 % (ref 36.0–46.0)
HEMOGLOBIN: 11.4 g/dL — AB (ref 12.0–15.0)
Lymphocytes Relative: 10 %
Lymphs Abs: 1.1 10*3/uL (ref 0.7–4.0)
MCH: 29.8 pg (ref 26.0–34.0)
MCHC: 31.2 g/dL (ref 30.0–36.0)
MCV: 95.3 fL (ref 78.0–100.0)
Monocytes Absolute: 1 10*3/uL (ref 0.1–1.0)
Monocytes Relative: 9 %
NEUTROS PCT: 81 %
Neutro Abs: 9.3 10*3/uL — ABNORMAL HIGH (ref 1.7–7.7)
Platelets: 201 10*3/uL (ref 150–400)
RBC: 3.83 MIL/uL — ABNORMAL LOW (ref 3.87–5.11)
RDW: 14.5 % (ref 11.5–15.5)
WBC: 11.4 10*3/uL — AB (ref 4.0–10.5)

## 2017-07-04 LAB — BASIC METABOLIC PANEL
Anion gap: 14 (ref 5–15)
BUN: 24 mg/dL — AB (ref 6–20)
CHLORIDE: 103 mmol/L (ref 101–111)
CO2: 20 mmol/L — ABNORMAL LOW (ref 22–32)
Calcium: 8.5 mg/dL — ABNORMAL LOW (ref 8.9–10.3)
Creatinine, Ser: 0.9 mg/dL (ref 0.44–1.00)
GFR calc Af Amer: >60 mL/min (ref >60)
GFR calc non Af Amer: 53 mL/min — ABNORMAL LOW (ref >60)
GLUCOSE: 111 mg/dL — AB (ref 65–99)
POTASSIUM: 4.3 mmol/L (ref 3.5–5.1)
Sodium: 137 mmol/L (ref 135–145)

## 2017-07-04 NOTE — Progress Notes (Signed)
     Subjective: 1 Day Post-Op Procedure(s) (LRB): ANTERIOR APPROACH HEMI HIP ARTHROPLASTY (Left)   Patient resting comfortably in bed.  No reported events throughout the night.  Objective:   VITALS:   Vitals:   07/04/17 0036 07/04/17 0533  BP: 105/69 135/72  Pulse: 99 91  Resp: 14 15  Temp: (!) 97.3 F (36.3 C) (!) 97.5 F (36.4 C)  SpO2: 99% 96%    Dorsiflexion/Plantar flexion intact Incision: dressing C/D/I No cellulitis present Compartment soft  LABS Recent Labs    07/02/17 2325 07/03/17 1722 07/04/17 0524  HGB 13.7 11.9* 11.4*  HCT 41.7 37.7 36.5  WBC 10.6* 14.9* 11.4*  PLT PLATELET CLUMPS NOTED ON SMEAR, COUNT APPEARS ADEQUATE 196 201    Recent Labs    07/02/17 2325 07/03/17 1722 07/04/17 0524  NA 138  --  137  K 3.3*  --  4.3  BUN 17  --  24*  CREATININE 0.62 0.71 0.90  GLUCOSE 95  --  111*     Assessment/Plan: 1 Day Post-Op Procedure(s) (LRB): ANTERIOR APPROACH HEMI HIP ARTHROPLASTY (Left)  Up with therapy Discharge disposition TBD   Anastasio Auerbach. Alda Gaultney   PAC  07/04/2017, 8:47 AM

## 2017-07-04 NOTE — Evaluation (Signed)
Physical Therapy Evaluation Patient Details Name: Dawn Gordon MRN: [id number] DOB: 19-Jul-1920 Today's Date: 07/04/2017   History of Present Illness  Elane P Ybarbo is a 84 y.o. female who complains of L hip pain, found to have L femoral neck fx. Patient has dementia and cannot recall an injury. s/p Left ANTERIOR APPROACH HEMI HIP ARTHROPLASTY   Clinical Impression  Pt admitted with above diagnosis. Pt currently with functional limitations due to the deficits listed below (see PT Problem List).  Pt was able to transfer with assist at her baseline, per son had started working on transfers with PT using walker; will follow in acute setting, recommend SNF post acute  Pt will benefit from skilled PT to increase their independence and safety with mobility to allow discharge to the venue listed below.       Follow Up Recommendations SNF    Equipment Recommendations  None recommended by PT    Recommendations for Other Services       Precautions / Restrictions Precautions Precautions: Fall Restrictions Weight Bearing Restrictions: No LLE Weight Bearing: Weight bearing as tolerated      Mobility  Bed Mobility Overal bed mobility: Needs Assistance Bed Mobility: Supine to Sit;Sit to Supine     Supine to sit: +2 for physical assistance;Total assist Sit to supine: Max assist;+2 for physical assistance   General bed mobility comments: assist to elevate  trunk and bring LEs off bed, bed pad used to scoot hips to EOB; pt initiated return to supine but required assist to control trunk and bring LEs onto bed, further max/total assist needed for supine positioning  Transfers                    Ambulation/Gait                Stairs            Wheelchair Mobility    Modified Rankin (Stroke Patients Only)       Balance Overall balance assessment: Needs assistance;History of Falls Sitting-balance support: Feet unsupported;Single extremity supported;Bilateral upper  extremity supported;No upper extremity supported Sitting balance-Leahy Scale: Fair       Standing balance-Leahy Scale: Zero Standing balance comment: unable to test d/t pain and cognition                             Pertinent Vitals/Pain Pain Assessment: Faces Faces Pain Scale: Hurts even more Pain Location: back, L hip Pain Descriptors / Indicators: Discomfort;Grimacing;Guarding Pain Intervention(s): Monitored during session;Premedicated before session    Home Living Family/patient expects to be discharged to:: Skilled nursing facility                      Prior Function Level of Independence: Needs assistance   Gait / Transfers Assistance Needed: assist to transfer bed to w/c           Hand Dominance        Extremity/Trunk Assessment   Upper Extremity Assessment Upper Extremity Assessment: Defer to OT evaluation    Lower Extremity Assessment Lower Extremity Assessment: Generalized weakness;LLE deficits/detail;RLE deficits/detail RLE Deficits / Details: AAROM grossly WFL, difficult to test fully d/t cognition, at least 3/5 LLE: Unable to fully assess due to pain       Communication   Communication: No difficulties  Cognition Arousal/Alertness: Awake/alert(sleepy  but arouses easily) Behavior During Therapy: WFL for tasks assessed/performed;Flat affect Overall Cognitive Status: History  of cognitive impairments - at baseline                                 General Comments: pt verbalizes very little during eval,  other than repeatedly stating "I want to lie down"      General Comments      Exercises     Assessment/Plan    PT Assessment Patient needs continued PT services  PT Problem List Decreased activity tolerance;Decreased strength;Decreased mobility;Decreased safety awareness;Decreased balance;Decreased knowledge of use of DME;Decreased range of motion;Decreased cognition       PT Treatment Interventions DME  instruction;Functional mobility training;Therapeutic activities;Therapeutic exercise;Patient/family education;Balance training    PT Goals (Current goals can be found in the Care Plan section)  Acute Rehab PT Goals Patient Stated Goal: unable to state PT Goal Formulation: With patient Time For Goal Achievement: 07/18/17 Potential to Achieve Goals: Fair    Frequency Min 2X/week   Barriers to discharge        Co-evaluation PT/OT/SLP Co-Evaluation/Treatment: Yes Reason for Co-Treatment: For patient/therapist safety PT goals addressed during session: Mobility/safety with mobility         AM-PAC PT "6 Clicks" Daily Activity  Outcome Measure Difficulty turning over in bed (including adjusting bedclothes, sheets and blankets)?: Unable Difficulty moving from lying on back to sitting on the side of the bed? : Unable Difficulty sitting down on and standing up from a chair with arms (e.g., wheelchair, bedside commode, etc,.)?: Unable Help needed moving to and from a bed to chair (including a wheelchair)?: Total Help needed walking in hospital room?: Total Help needed climbing 3-5 steps with a railing? : Total 6 Click Score: 6    End of Session   Activity Tolerance: Patient limited by pain;Other (comment)(cognition) Patient left: in bed;with bed alarm set;with call bell/phone within reach;with family/visitor present Nurse Communication: Mobility status      Time: 1610-9604 PT Time Calculation (min) (ACUTE ONLY): 15 min   Charges:   PT Evaluation $PT Eval Low Complexity: 1 Low     PT G CodesDrucilla Chalet, PT Pager: [phone] 07/04/2017   [facility] 07/04/2017, 12:13 PM

## 2017-07-04 NOTE — Progress Notes (Signed)
PROGRESS NOTE  Dawn Gordon:[id number] DOB: 09-06-1920 DOA: 07/02/2017 PCP: Brooke Bonito, MD  HPI/Recap of past 24 hours: Dawn Gordon is a 84 y.o. female with medical history significant of Alzheimer's dementia and hyperlipidemia who presents with left hip pain. Patient is accompanied by family who reports that she has had some left hip pain for the last few days. X-ray showed a left femoral neck fracture.  Family unaware if patient's ever fell/trauma.  Patient has baseline dementia and unable to give details.  Admitted for further management with orthopedics on consult.  Today, patient has no new complaints, although hx of dementia. Looked comfortable.   Assessment/Plan: Principal Problem:   Closed left hip fracture, initial encounter [facility]) Active Problems:   Alzheimer's dementia without behavioral disturbance   Displaced fracture of left femoral neck (HCC)  Acute displaced left femoral neck fracture Status post left hip hemiarthroplasty on 07/03/2017 Unknown etiology as patient has dementia, likely mechanical fall Pain management Orthopedics on board: DVT ppx, PT/OT Fall precautions Monitor closely  Dementia Continue Aricept and Namenda     Code Status: Full  Family Communication: None at bedside  Disposition Plan: SNF   Consultants:  Orthopedics  Procedures:  Left hip hemiarthroplasty on 07/03/2017  Antimicrobials:  None  DVT prophylaxis: Lovenox   Objective: Vitals:   07/04/17 0036 07/04/17 0533 07/04/17 0948 07/04/17 1353  BP: 105/69 135/72 116/68 (!) 98/45  Pulse: 99 91 79 77  Resp: Temp: (!) 97.3 F (36.3 C) (!) 97.5 F (36.4 C) 98.3 F (36.8 C) 98 F (36.7 C)  TempSrc: Oral Oral Oral Oral  SpO2: 99% 96%  93%  Weight:      Height:        Intake/Output Summary (Last 24 hours) at 07/04/2017 1458 Last data filed at 07/04/2017 0830 Gross per 24 hour  Intake 1423.5 ml  Output 225 ml  Net 1198.5 ml   Filed Weights   07/02/17 2108 07/02/17 2225 07/03/17 0256  Weight: 68 kg (150 lb) 68 kg (150 lb) 72.6 kg (160 lb)    Exam:   General: NAD  Cardiovascular: S1, S2 present  Respiratory: CTA B  Abdomen: Soft, nontender, nondistended, bowel sounds present  Musculoskeletal: No pedal edema bilaterally, L hip dressing C/D/I  Skin: Normal  Psychiatry: Unable to assess   Data Reviewed: CBC: Recent Labs  Lab 07/02/17 2325 07/03/17 1722 07/04/17 0524  WBC 10.6* 14.9* 11.4*  NEUTROABS 7.9  --  9.3*  HGB 13.7 11.9* 11.4*  HCT 41.7 37.7 36.5  MCV 94.1 95.4 95.3  PLT PLATELET CLUMPS NOTED ON SMEAR, COUNT APPEARS ADEQUATE 196 201   Basic Metabolic Panel: Recent Labs  Lab 07/02/17 2325 07/03/17 1722 07/04/17 0524  NA 138  --  137  K 3.3*  --  4.3  CL 105  --  103  CO2 22  --  20*  GLUCOSE 95  --  111*  BUN 17  --  24*  CREATININE 0.62 0.71 0.90  CALCIUM 8.6*  --  8.5*   GFR: Estimated Creatinine Clearance: 34 mL/min (by C-G formula based on SCr of 0.9 mg/dL). Liver Function Tests: Recent Labs  Lab 07/02/17 2325  AST 16  ALT 11*  ALKPHOS 136*  BILITOT 1.0  PROT 6.8  ALBUMIN 3.5   No results for input(s): LIPASE, AMYLASE in the last 168 hours. No results for input(s): AMMONIA in the last 168 hours. Coagulation Profile: Recent Labs  Lab 07/02/17 2240  INR 1.51   Cardiac Enzymes: No results for input(s): CKTOTAL, CKMB, CKMBINDEX, TROPONINI in the last 168 hours. BNP (last 3 results) No results for input(s): PROBNP in the last 8760 hours. HbA1C: No results for input(s): HGBA1C in the last 72 hours. CBG: No results for input(s): GLUCAP in the last 168 hours. Lipid Profile: No results for input(s): CHOL, HDL, LDLCALC, TRIG, CHOLHDL, LDLDIRECT in the last 72 hours. Thyroid Function Tests: No results for input(s): TSH, T4TOTAL, FREET4, T3FREE, THYROIDAB in the last 72 hours. Anemia Panel: No results for input(s): VITAMINB12, FOLATE, FERRITIN, TIBC, IRON, RETICCTPCT in the  last 72 hours. Urine analysis: No results found for: COLORURINE, APPEARANCEUR, LABSPEC, PHURINE, GLUCOSEU, HGBUR, BILIRUBINUR, KETONESUR, PROTEINUR, UROBILINOGEN, NITRITE, LEUKOCYTESUR Sepsis Labs: (procalcitonin:4,lacticidven:4)  ) Recent Results (from the past 240 hour(s))  MRSA PCR Screening     Status: None   Collection Time: 07/03/17  3:31 AM  Result Value Ref Range Status   MRSA by PCR NEGATIVE NEGATIVE Final    Comment:        The GeneXpert MRSA Assay (FDA approved for NASAL specimens only), is one component of a comprehensive MRSA colonization surveillance program. It is not intended to diagnose MRSA infection nor to guide or monitor treatment for MRSA infections. Performed at [facility], [address], [place], [place] [address]       Studies: Pelvis Portable  Result Date: 07/03/2017 CLINICAL DATA:  Status post left hip arthroplasty EXAM: PORTABLE PELVIS 1-2 VIEWS COMPARISON:  Intraoperative fluoroscopic radiographs dated 07/03/2017 FINDINGS: Left hip hemiarthroplasty in satisfactory position. Mild degenerative changes of the right hip. Visualized bony pelvis appears intact. IMPRESSION: Left hip hemiarthroplasty in satisfactory position. Electronically Signed   By: Charline Bills M.D.   On: 07/03/2017 18:23    Scheduled Meds: . buPROPion  75 mg Oral BID  . docusate sodium  100 mg Oral BID  . donepezil  10 mg Oral QHS  . enoxaparin (LOVENOX) injection  30 mg Subcutaneous Q24H  . escitalopram  10 mg Oral Daily  . feeding supplement (ENSURE ENLIVE)  237 mL Oral BID BM  . memantine  10 mg Oral BID  . multivitamin   Oral Daily  . simvastatin  40 mg Oral q1800  . traZODone  50 mg Oral QHS    Continuous Infusions: . sodium chloride       LOS: 1 day     Briant Cedar, MD [facility]   If 7PM-7AM, please contact night-coverage [email or web address] Password [email or web address] 07/04/2017, 2:58 PM

## 2017-07-04 NOTE — Evaluation (Signed)
Occupational Therapy Evaluation Patient Details Name: Dawn Gordon MRN: [id number] DOB: 11-Jul-1920 Today's Date: 07/04/2017    History of Present Illness Dawn Gordon is a 84 y.o. female who complains of L hip pain, found to have L femoral neck fx. Patient has dementia and cannot recall an injury. s/p Left ANTERIOR APPROACH HEMI HIP ARTHROPLASTY    Clinical Impression   Pt was admitted for the above. Spoke to son who reported that pt performed SPTs to w/c and toilet with +1 assistance at her ALF. She also fed herself and performed grooming. Will trial OT in acute setting with mod +2 level goals.  Pt did sit EOB but needed total +2 assistance to sit up and max +2 to return to supine.  Pt kept asking to lie back down when sitting EOB. She did perform a couple of grooming tasks while seated    Follow Up Recommendations  SNF    Equipment Recommendations  (to be further assessed)    Recommendations for Other Services       Precautions / Restrictions Precautions Precautions: Fall Restrictions Weight Bearing Restrictions: No LLE Weight Bearing: Weight bearing as tolerated      Mobility Bed Mobility Overal bed mobility: Needs Assistance Bed Mobility: Supine to Sit;Sit to Supine     Supine to sit: +2 for physical assistance;Total assist Sit to supine: Max assist;+2 for physical assistance   General bed mobility comments: assist to elevate  trunk and bring LEs off bed, bed pad used to scoot hips to EOB; pt initiated return to supine but required assist to control trunk and bring LEs onto bed, further max/total assist needed for supine positioning  Transfers                      Balance Overall balance assessment: Needs assistance;History of Falls Sitting-balance support: Feet unsupported;Single extremity supported;Bilateral upper extremity supported;No upper extremity supported Sitting balance-Leahy Scale: Fair       Standing balance-Leahy Scale: Zero Standing  balance comment: unable to test d/t pain and cognition                           ADL either performed or assessed with clinical judgement   ADL Overall ADL's : Needs assistance/impaired       Grooming Details (indicate cue type and reason): set up for face; mod A for hair.  Pt donned glasses with set up                               General ADL Comments: total A for adls, +2 LB except for grooming. Did not see self feeding     Vision Baseline Vision/History: Wears glasses       Perception     Praxis      Pertinent Vitals/Pain Pain Assessment: Faces Faces Pain Scale: Hurts even more Pain Location: back, L hip Pain Descriptors / Indicators: Discomfort;Grimacing;Guarding Pain Intervention(s): Limited activity within patient's tolerance;Monitored during session;Premedicated before session;Repositioned;Ice applied     Hand Dominance     Extremity/Trunk Assessment Upper Extremity Assessment Upper Extremity Assessment: Generalized weakness; supported herself with bil UEs when sitting then released R hand for grooming tasks   Lower Extremity Assessment Lower Extremity Assessment: Generalized weakness;LLE deficits/detail;RLE deficits/detail RLE Deficits / Details: AAROM grossly WFL, difficult to test fully d/t cognition, at least 3/5 LLE: Unable to fully assess due to pain  Communication Communication Communication: HOH(little verbalization)   Cognition Arousal/Alertness: Awake/alert Behavior During Therapy: WFL for tasks assessed/performed;Flat affect Overall Cognitive Status: History of cognitive impairments - at baseline                                 General Comments: pt verbalizes very little during eval,  other than repeatedly stating "I want to lie down"   General Comments       Exercises     Shoulder Instructions      Home Living Family/patient expects to be discharged to:: Skilled nursing facility                                  Additional Comments: from [facility]      Prior Functioning/Environment Level of Independence: Needs assistance  Gait / Transfers Assistance Needed: assist to transfer bed to w/c ADL's / Homemaking Assistance Needed: assist for bathing and dressing. Pt feeds self and participates in grooming. Assist for SPT to toilet            OT Problem List: Decreased strength;Decreased activity tolerance;Decreased cognition;Decreased knowledge of use of DME or AE;Pain;Decreased safety awareness      OT Treatment/Interventions: Self-care/ADL training;DME and/or AE instruction;Patient/family education;Cognitive remediation/compensation    OT Goals(Current goals can be found in the care plan section) Acute Rehab OT Goals Patient Stated Goal: unable to state OT Goal Formulation: Patient unable to participate in goal setting Time For Goal Achievement: 07/18/17 Potential to Achieve Goals: Fair ADL Goals Pt Will Transfer to Toilet: with mod assist;with +2 assist;bedside commode;stand pivot transfer Additional ADL Goal #1: pt will go from sit to stand with mod +2 and maintain with min A for 2 minutes for adls Additional ADL Goal #2: pt will perform bed mobility with mod +2 assistance in preparation for adls  OT Frequency: Min 1X/week   Barriers to D/C:            Co-evaluation PT/OT/SLP Co-Evaluation/Treatment: Yes Reason for Co-Treatment: For patient/therapist safety PT goals addressed during session: Mobility/safety with mobility OT goals addressed during session: ADL's and self-care      AM-PAC PT "6 Clicks" Daily Activity     Outcome Measure Help from another person eating meals?: A Lot Help from another person taking care of personal grooming?: A Lot Help from another person toileting, which includes using toliet, bedpan, or urinal?: Total Help from another person bathing (including washing, rinsing, drying)?: Total Help from another person to put  on and taking off regular upper body clothing?: Total Help from another person to put on and taking off regular lower body clothing?: Total 6 Click Score: 8   End of Session    Activity Tolerance: Patient limited by fatigue;Patient limited by pain Patient left: in bed;with call bell/phone within reach;with bed alarm set;with family/visitor present  OT Visit Diagnosis: Pain Pain - Right/Left: Left Pain - part of body: Hip                Time: 1610-9604 OT Time Calculation (min): 14 min Charges:  OT General Charges $OT Visit: 1 Visit OT Evaluation $OT Eval Low Complexity: 1 Low G-Codes:     [place], OTR/L [phone] 07/04/2017  Jaedon Siler 07/04/2017, 12:50 PM

## 2017-07-04 NOTE — NC FL2 (Signed)
[place] MEDICAID FL2 LEVEL OF CARE SCREENING TOOL     IDENTIFICATION  Patient Name: Dawn Gordon Birthdate: 23-Apr-1920 Sex: female Admission Date (Current Location): 07/02/2017  [facility] and [place] Number:  Producer, television/film/video and Address:  [facility],  [address], [place] [address]      Provider Number: [id number]  Attending Physician Name and Address:  Briant Cedar, MD  Relative Name and Phone Number:       Current Level of Care: Hospital Recommended Level of Care: Skilled Nursing Facility Prior Approval Number:    Date Approved/Denied:   PASRR Number:   [id number]   Discharge Plan: SNF    Current Diagnoses: Patient Active Problem List   Diagnosis Date Noted  . Closed left hip fracture, initial encounter (HCC) 07/03/2017  . Alzheimer's dementia without behavioral disturbance 07/03/2017  . Displaced fracture of left femoral neck (HCC) 07/03/2017    Orientation RESPIRATION BLADDER Height & Weight     Self  Normal Continent Weight: 160 lb (72.6 kg) Height:  5' (152.4 cm)  BEHAVIORAL SYMPTOMS/MOOD NEUROLOGICAL BOWEL NUTRITION STATUS      Continent Diet(See discharge Summary )  AMBULATORY STATUS COMMUNICATION OF NEEDS Skin   Extensive Assist Verbally Normal                       Personal Care Assistance Level of Assistance  Bathing, Feeding, Dressing Bathing Assistance: Maximum assistance Feeding assistance: Independent Dressing Assistance: Maximum assistance     Functional Limitations Info  Sight, Hearing, Speech Sight Info: Adequate Hearing Info: Adequate Speech Info: Adequate    SPECIAL CARE FACTORS FREQUENCY  PT (By licensed PT), OT (By licensed OT)     PT Frequency: 5x/week  OT Frequency: 5x/week            Contractures Contractures Info: Not present    Additional Factors Info  Code Status, Allergies Code Status Info: Fullcode Allergies Info: Allergies: No Known Allergies           Current  Medications (07/04/2017):  This is the current hospital active medication list Current Facility-Administered Medications  Medication Dose Route Frequency Provider Last Rate Last Dose  . 0.9 %  sodium chloride infusion   Intravenous Once Samson Frederic, MD      . buPROPion [facility]) tablet 75 mg  75 mg Oral BID Samson Frederic, MD   75 mg at 07/04/17 1036  . docusate sodium (COLACE) capsule 100 mg  100 mg Oral BID Samson Frederic, MD   100 mg at 07/04/17 1036  . donepezil (ARICEPT) tablet 10 mg  10 mg Oral QHS Samson Frederic, MD   10 mg at 07/03/17 2123  . enoxaparin (LOVENOX) injection 30 mg  30 mg Subcutaneous Q24H Samson Frederic, MD   30 mg at 07/04/17 0750  . escitalopram (LEXAPRO) tablet 10 mg  10 mg Oral Daily Samson Frederic, MD   10 mg at 07/04/17 1036  . feeding supplement (ENSURE ENLIVE) (ENSURE ENLIVE) liquid 237 mL  237 mL Oral BID BM Samson Frederic, MD   237 mL at 07/04/17 1041  . HYDROcodone-acetaminophen (NORCO/VICODIN) 5-325 MG per tablet 1-2 tablet  1-2 tablet Oral Q6H PRN Samson Frederic, MD   1 tablet at 07/04/17 1055  . meclizine (ANTIVERT) tablet 12.5 mg  12.5 mg Oral TID PRN Samson Frederic, MD      . memantine [facility]) tablet 10 mg  10 mg Oral BID Samson Frederic, MD   10 mg at 07/04/17  1036  . menthol-cetylpyridinium (CEPACOL) lozenge 3 mg  1 lozenge Oral PRN Swinteck, Arlys John, MD       Or  . phenol (CHLORASEPTIC) mouth spray 1 spray  1 spray Mouth/Throat PRN Swinteck, Arlys John, MD      . metoCLOPramide (REGLAN) tablet 5-10 mg  5-10 mg Oral Q8H PRN Swinteck, Arlys John, MD       Or  . metoCLOPramide (REGLAN) injection 5-10 mg  5-10 mg Intravenous Q8H PRN Swinteck, Arlys John, MD      . morphine 2 MG/ML injection 0.5 mg  0.5 mg Intravenous Q2H PRN Samson Frederic, MD      . multivitamin (PROSIGHT) tablet   Oral Daily Samson Frederic, MD   1 tablet at 07/04/17 1036  . ondansetron (ZOFRAN) tablet 4 mg  4 mg Oral Q6H PRN Swinteck, Arlys John, MD       Or  . ondansetron (ZOFRAN) injection  4 mg  4 mg Intravenous Q6H PRN Swinteck, Arlys John, MD      . polyvinyl alcohol (LIQUIFILM TEARS) 1.4 % ophthalmic solution 1 drop  1 drop Both Eyes TID PRN Swinteck, Arlys John, MD      . simvastatin (ZOCOR) tablet 40 mg  40 mg Oral q1800 Swinteck, Arlys John, MD      . traZODone (DESYREL) tablet 50 mg  50 mg Oral QHS Samson Frederic, MD   50 mg at 07/03/17 2123     Discharge Medications: Please see discharge summary for a list of discharge medications.  Relevant Imaging Results:  Relevant Lab Results:   Additional Information ssn:[id number]  Clearance Coots, LCSW

## 2017-07-04 NOTE — Evaluation (Signed)
Clinical/Bedside Swallow Evaluation Patient Details  Name: Dawn Gordon MRN: [id number] Date of Birth: 1920/12/23  Today's Date: 07/04/2017 Time: SLP Start Time (ACUTE ONLY): 0920 SLP Stop Time (ACUTE ONLY): 0940 SLP Time Calculation (min) (ACUTE ONLY): 20 min  Past Medical History:  Past Medical History:  Diagnosis Date  . Amnesia   . Hyperlipidemia   . Overactive bladder   . Sleep apnea    Past Surgical History:  Past Surgical History:  Procedure Laterality Date  . ANKLE SURGERY    . BACK SURGERY    . KNEE SURGERY     HPI:  Patient is a 83 y.o. female, resident of SNF who was admitted to hospital with left hip pain for a few days prior to admission. X-ray revealed left femoral neck fracture. Patient had left hip hemiarthroplasty on 07/03/17. PMH: Alzheimer's dementia s/o behavioral disturbance.   Assessment / Plan / Recommendation Clinical Impression  Patient presents with a mild oral dysphagia characterized by decreased mastication, prolonged oral transit and mild amount of residuals remaining in oral cavity after initial swallow. Patient did not exhibit any overt s/s of aspiration or penetration with boluses of solid textures or with boluses of thin liquids via straw sips.  SLP Visit Diagnosis: Dysphagia, oral phase (R13.11)    Aspiration Risk  Mild aspiration risk    Diet Recommendation     Liquid Administration via: Straw;Cup Medication Administration: Whole meds with liquid Supervision: Staff to assist with self feeding Compensations: Minimize environmental distractions;Slow rate;Small sips/bites Postural Changes: Seated upright at 90 degrees    Other  Recommendations Oral Care Recommendations: Oral care BID   Follow up Recommendations None      Frequency and Duration min 1 x/week  1 week  Patient will benefit from one diet check and then discharge from speech therapy services of [place].      Prognosis Prognosis for Safe Diet Advancement: Good      Swallow  Study   General Date of Onset: 07/02/17 HPI: Patient is a 84 y.o. female, resident of SNF who was admitted to hospital with left hip pain for a few days prior to admission. X-ray revealed left femoral neck fracture. Patient had left hip hemiarthroplasty on 07/03/17. PMH: Alzheimer's dementia s/o behavioral disturbance. Type of Study: Bedside Swallow Evaluation Previous Swallow Assessment: N/A Diet Prior to this Study: Regular;Thin liquids Temperature Spikes Noted: No Respiratory Status: Nasal cannula History of Recent Intubation: No Behavior/Cognition: Alert;Lethargic/Drowsy;Cooperative Oral Cavity Assessment: Within Functional Limits Oral Care Completed by SLP: No Oral Cavity - Dentition: Dentures, top;Dentures, bottom Self-Feeding Abilities: Needs set up;Needs assist Patient Positioning: Upright in bed Baseline Vocal Quality: Normal Volitional Cough: Weak Volitional Swallow: Able to elicit    Oral/Motor/Sensory Function Overall Oral Motor/Sensory Function: Generalized oral weakness Facial ROM: Within Functional Limits Facial Symmetry: Within Functional Limits Facial Strength: Within Functional Limits Facial Sensation: Within Functional Limits Lingual ROM: Within Functional Limits Lingual Symmetry: Within Functional Limits Lingual Strength: Reduced Lingual Sensation: Within Functional Limits Velum: Within Functional Limits Mandible: Within Functional Limits   Ice Chips Ice chips: Not tested   Thin Liquid Thin Liquid: Within functional limits Presentation: Straw Other Comments: timely swallow initiation, good laryngeal elevation and pharyngeal contraction per palpation, no overt s/s or penetration or aspiration    Nectar Thick Nectar Thick Liquid: Not tested   Honey Thick Honey Thick Liquid: Not tested   Puree Puree: Not tested   Solid   GO   Solid: Impaired Oral Phase Impairments: Impaired mastication Oral Phase  Functional Implications: Oral residue        Angela Nevin, MA, CCC-SLP 07/04/17 1:48 PM

## 2017-07-04 NOTE — Clinical Social Work Note (Signed)
Clinical Social Work Assessment  Patient Details  Name: Dawn Gordon MRN: [id number] Date of Birth: Jan 26, 1921  Date of referral:  07/04/17               Reason for consult:  Facility Placement, Discharge Planning                Permission sought to share information with:  Family Supports Permission granted to share information::     Name::        Agency::  [facility]   Relationship::  Son/ POA- Dealer Information:  [phone]  Housing/Transportation Living arrangements for the past 2 months:  [place] of Information:    Patient Interpreter Needed:    Criminal Activity/Legal Involvement Pertinent to Current Situation/Hospitalization:  No - Comment as needed Significant Relationships:  Adult Children Lives with:  Facility Resident Do you feel safe going back to the place where you live?  Yes Need for family participation in patient care:  Yes (Comment)  Care giving concerns:   SNF for short rehab before returning to [place].   Social Worker assessment / plan:  CSW met with patient son-Tim at bedside, explain role and reason for visit- to assist with discharge plan to SNF. Patient son reports the patient is a resident at [facility]. He explain the patient uses a wheel and walker for mobile assistance. The patient needs assistance with bathing and dressing. Son reports the patient has never been to a SNF for rehab. CSW explain SNF process/ Insurance required authorization. Patient son reports understanding. Patient son request a SNF in the [place] area.  FL2 complete.   Plan: SNF Discharge Barriers: Ship broker.   Employment status:  Retired Surveyor, [employer] Care PT Recommendations:  [place] / Referral to community resources:  [place]  Patient/Family's Response to care:  Patient had  dementia. Patient son agreeable to treatment plan.   Patient/Family's Understanding of and Emotional Response to Diagnosis, Current Treatment, and Prognosis:  Patient son has a good understanding of patient diagnosis and care. He is hopeful the patient will transition well to SNF for rehab.   Emotional Assessment Appearance:  Appears stated age Attitude/Demeanor/Rapport:    Affect (typically observed):  Accepting Orientation:  Oriented to Self, Oriented to Place, Oriented to  Time, Oriented to Situation Alcohol / Substance use:  Not Applicable Psych involvement (Current and /or in the community):  No (Comment)  Discharge Needs  Concerns to be addressed:  Decision making concerns, Discharge Planning Concerns Readmission within the last 30 days:  No Current discharge risk:  Dependent with Mobility, None Barriers to Discharge:  Ship broker, Continued Medical Work up   [employer], [place] 07/04/2017, 11:47 AM

## 2017-07-04 NOTE — Progress Notes (Signed)
Chaplain responded to electronic request to visit Chaplain. The nature of the visit was not on the request and pt did not seem to know the purpose for a visit. Please page if additional assistance is needed by family or pt. Chaplain Elmarie Shiley [place], [place]   07/04/17 1700  Clinical Encounter Type  Visited With Patient

## 2017-07-05 LAB — BASIC METABOLIC PANEL
Anion gap: 9 (ref 5–15)
BUN: 33 mg/dL — AB (ref 6–20)
CALCIUM: 8.3 mg/dL — AB (ref 8.9–10.3)
CO2: 28 mmol/L (ref 22–32)
CREATININE: 0.82 mg/dL (ref 0.44–1.00)
Chloride: 102 mmol/L (ref 101–111)
GFR calc Af Amer: >60 mL/min (ref >60)
GFR, EST NON AFRICAN AMERICAN: 59 mL/min — AB (ref >60)
GLUCOSE: 106 mg/dL — AB (ref 65–99)
Potassium: 3.3 mmol/L — ABNORMAL LOW (ref 3.5–5.1)
Sodium: 139 mmol/L (ref 135–145)

## 2017-07-05 LAB — CBC WITH DIFFERENTIAL/PLATELET
BASOS ABS: 0 10*3/uL (ref 0.0–0.1)
Basophils Relative: 0 %
EOS PCT: 0 %
Eosinophils Absolute: 0 10*3/uL (ref 0.0–0.7)
HCT: 31.7 % — ABNORMAL LOW (ref 36.0–46.0)
Hemoglobin: 10.2 g/dL — ABNORMAL LOW (ref 12.0–15.0)
LYMPHS ABS: 1.7 10*3/uL (ref 0.7–4.0)
LYMPHS PCT: 17 %
MCH: 30.4 pg (ref 26.0–34.0)
MCHC: 32.2 g/dL (ref 30.0–36.0)
MCV: 94.6 fL (ref 78.0–100.0)
MONO ABS: 1.2 10*3/uL — AB (ref 0.1–1.0)
Monocytes Relative: 13 %
Neutro Abs: 6.9 10*3/uL (ref 1.7–7.7)
Neutrophils Relative %: 70 %
PLATELETS: 219 10*3/uL (ref 150–400)
RBC: 3.35 MIL/uL — ABNORMAL LOW (ref 3.87–5.11)
RDW: 14.5 % (ref 11.5–15.5)
WBC: 9.8 10*3/uL (ref 4.0–10.5)

## 2017-07-05 MED ORDER — POTASSIUM CHLORIDE CRYS ER 20 MEQ PO TBCR
40.0000 meq | EXTENDED_RELEASE_TABLET | Freq: Once | ORAL | Status: AC
  Administered 2017-07-05: 40 meq via ORAL
  Filled 2017-07-05: qty 2

## 2017-07-05 NOTE — Progress Notes (Signed)
  Speech Language Pathology Treatment: Dysphagia  Patient Details Name: CAMI DELAWDER MRN: [id number] DOB: [birth year] Today's Date: 07/05/2017 Time: 6073-7106 SLP Time Calculation (min) (ACUTE ONLY): 14 min  Assessment / Plan / Recommendation Clinical Impression  F/u diet tolerance assessment complete, revealing a functional and normal oropharyngeal swallow with solids and liquids. Grandaughter present who is an Therapist, sports and reports no h/o dysphagia or recently observed difficulty. Vital signs remaining stable. No additional SLP services indicated.    HPI HPI: Patient is a 84 y.o. female, resident of SNF who was admitted to hospital with left hip pain for a few days prior to admission. X-ray revealed left femoral neck fracture. Patient had left hip hemiarthroplasty on 07/03/17. PMH: Alzheimer's dementia s/o behavioral disturbance.      SLP Plan  All goals met       Recommendations  Diet recommendations: Regular;Thin liquid Liquids provided via: Cup;Straw Medication Administration: Whole meds with liquid Supervision: Patient able to self feed Compensations: Slow rate;Small sips/bites;Minimize environmental distractions Postural Changes and/or Swallow Maneuvers: Seated upright 90 degrees                Oral Care Recommendations: Oral care BID Follow up Recommendations: None SLP Visit Diagnosis: Dysphagia, oral phase (R13.11) Plan: All goals met       Gabriel Rainwater MA, CCC-SLP [phone]                Jancarlo Biermann [place] 07/05/2017, 3:06 PM

## 2017-07-05 NOTE — Progress Notes (Signed)
PROGRESS NOTE  Dawn Gordon ZOX:[id number] DOB: 12-16-1920 DOA: 07/02/2017 PCP: Dawn Bonito, MD  HPI/Recap of past 24 hours: Dawn Gordon is a 84 y.o. female with medical history significant of Alzheimer's dementia and hyperlipidemia who presents with left hip pain. Patient is accompanied by family who reports that she has had some left hip pain for the last few days. X-ray showed a left femoral neck fracture.  Family unaware if patient's ever fell/trauma.  Patient has baseline dementia and unable to give details.  Admitted for further management with orthopedics on consult.  Today, patient reports some discomfort in L hip, denies any other complaints.     Assessment/Plan: Principal Problem:   Closed left hip fracture, initial encounter [facility] Active Problems:   Alzheimer's dementia without behavioral disturbance   Displaced fracture of left femoral neck (HCC)  Acute displaced left femoral neck fracture Status post left hip hemiarthroplasty on 07/03/2017 Unknown etiology as patient has dementia, likely mechanical fall Pain management Orthopedics on board: DVT ppx, PT/OT Fall precautions Monitor closely  Dementia Continue Aricept and Namenda     Code Status: Full  Family Communication: None at bedside  Disposition Plan: SNF   Consultants:  Orthopedics  Procedures:  Left hip hemiarthroplasty on 07/03/2017  Antimicrobials:  None  DVT prophylaxis: Lovenox   Objective: Vitals:   07/04/17 0948 07/04/17 1353 07/04/17 2114 07/05/17 0604  BP: 116/68 (!) 98/45 (!) 105/50 (!) 126/55  Pulse: 79 77 100 76  Resp: Temp: 98.3 F (36.8 C) 98 F (36.7 C) 97.9 F (36.6 C) 98 F (36.7 C)  TempSrc: Oral Oral Oral Oral  SpO2:  93% 95% 100%  Weight:      Height:        Intake/Output Summary (Last 24 hours) at 07/05/2017 1437 Last data filed at 07/05/2017 0915 Gross per 24 hour  Intake 120 ml  Output 400 ml  Net -280 ml   Filed Weights   07/02/17  2108 07/02/17 2225 07/03/17 0256  Weight: 68 kg (150 lb) 68 kg (150 lb) 72.6 kg (160 lb)    Exam:   General: NAD  Cardiovascular: S1, S2 present  Respiratory: CTA B  Abdomen: Soft, nontender, nondistended, bowel sounds present  Musculoskeletal: No pedal edema bilaterally, L hip dressing C/D/I  Skin: Normal  Psychiatry: Unable to assess   Data Reviewed: CBC: Recent Labs  Lab 07/02/17 2325 07/03/17 1722 07/04/17 0524 07/05/17 0513  WBC 10.6* 14.9* 11.4* 9.8  NEUTROABS 7.9  --  9.3* 6.9  HGB 13.7 11.9* 11.4* 10.2*  HCT 41.7 37.7 36.5 31.7*  MCV 94.1 95.4 95.3 94.6  PLT PLATELET CLUMPS NOTED ON SMEAR, COUNT APPEARS ADEQUATE 196 201 219   Basic Metabolic Panel: Recent Labs  Lab 07/02/17 2325 07/03/17 1722 07/04/17 0524 07/05/17 0513  NA 138  --  137 139  K 3.3*  --  4.3 3.3*  CL 105  --  103 102  CO2 22  --  20* 28  GLUCOSE 95  --  111* 106*  BUN 17  --  24* 33*  CREATININE 0.62 0.71 0.90 0.82  CALCIUM 8.6*  --  8.5* 8.3*   GFR: Estimated Creatinine Clearance: 37.3 mL/min (by C-G formula based on SCr of 0.82 mg/dL). Liver Function Tests: Recent Labs  Lab 07/02/17 2325  AST 16  ALT 11*  ALKPHOS 136*  BILITOT 1.0  PROT 6.8  ALBUMIN 3.5   No results for input(s): LIPASE, AMYLASE in the last  168 hours. No results for input(s): AMMONIA in the last 168 hours. Coagulation Profile: Recent Labs  Lab 07/02/17 2240  INR 1.51   Cardiac Enzymes: No results for input(s): CKTOTAL, CKMB, CKMBINDEX, TROPONINI in the last 168 hours. BNP (last 3 results) No results for input(s): PROBNP in the last 8760 hours. HbA1C: No results for input(s): HGBA1C in the last 72 hours. CBG: No results for input(s): GLUCAP in the last 168 hours. Lipid Profile: No results for input(s): CHOL, HDL, LDLCALC, TRIG, CHOLHDL, LDLDIRECT in the last 72 hours. Thyroid Function Tests: No results for input(s): TSH, T4TOTAL, FREET4, T3FREE, THYROIDAB in the last 72 hours. Anemia  Panel: No results for input(s): VITAMINB12, FOLATE, FERRITIN, TIBC, IRON, RETICCTPCT in the last 72 hours. Urine analysis: No results found for: COLORURINE, APPEARANCEUR, LABSPEC, PHURINE, GLUCOSEU, HGBUR, BILIRUBINUR, KETONESUR, PROTEINUR, UROBILINOGEN, NITRITE, LEUKOCYTESUR Sepsis Labs: (procalcitonin:4,lacticidven:4)  ) Recent Results (from the past 240 hour(s))  MRSA PCR Screening     Status: None   Collection Time: 07/03/17  3:31 AM  Result Value Ref Range Status   MRSA by PCR NEGATIVE NEGATIVE Final    Comment:        The GeneXpert MRSA Assay (FDA approved for NASAL specimens only), is one component of a comprehensive MRSA colonization surveillance program. It is not intended to diagnose MRSA infection nor to guide or monitor treatment for MRSA infections. Performed at [facility], [address], [place], [place] [address]       Studies: No results found.  Scheduled Meds: . buPROPion  75 mg Oral BID  . docusate sodium  100 mg Oral BID  . donepezil  10 mg Oral QHS  . enoxaparin (LOVENOX) injection  30 mg Subcutaneous Q24H  . escitalopram  10 mg Oral Daily  . feeding supplement (ENSURE ENLIVE)  237 mL Oral BID BM  . memantine  10 mg Oral BID  . multivitamin   Oral Daily  . simvastatin  40 mg Oral q1800  . traZODone  50 mg Oral QHS    Continuous Infusions: . sodium chloride       LOS: 2 days     Briant Cedar, MD [facility]   If 7PM-7AM, please contact night-coverage [email or web address] Password Dawn Gordon [facility] 07/05/2017, 2:37 PM

## 2017-07-05 NOTE — Progress Notes (Signed)
     Subjective: 2 Days Post-Op Procedure(s) (LRB): ANTERIOR APPROACH HEMI HIP ARTHROPLASTY (Left)   Patient reports pain as mild, pain controlled. States that it is bothering her some.  No events throughout the night.    Objective:   VITALS:   Vitals:   07/04/17 2114 07/05/17 0604  BP: (!) 105/50 (!) 126/55  Pulse: 100 76  Resp: 16 15  Temp: 97.9 F (36.6 C) 98 F (36.7 C)  SpO2: 95% 100%    Dorsiflexion/Plantar flexion intact Incision: dressing C/D/I No cellulitis present Compartment soft  LABS Recent Labs    07/03/17 1722 07/04/17 0524 07/05/17 0513  HGB 11.9* 11.4* 10.2*  HCT 37.7 36.5 31.7*  WBC 14.9* 11.4* 9.8  PLT 196 201 219    Recent Labs    07/02/17 2325 07/03/17 1722 07/04/17 0524 07/05/17 0513  NA 138  --  137 139  K 3.3*  --  4.3 3.3*  BUN 17  --  24* 33*  CREATININE 0.62 0.71 0.90 0.82  GLUCOSE 95  --  111* 106*     Assessment/Plan: 2 Days Post-Op Procedure(s) (LRB): ANTERIOR APPROACH HEMI HIP ARTHROPLASTY (Left)  Up with therapy Discharge disposition to be determined Dressing good, may remain in place until follow up        Anastasio Auerbach. Allexus Ovens   PAC  07/05/2017, 7:17 AM

## 2017-07-06 ENCOUNTER — Encounter (HOSPITAL_COMMUNITY): Payer: Self-pay | Admitting: Orthopedic Surgery

## 2017-07-06 LAB — CBC WITH DIFFERENTIAL/PLATELET
Basophils Absolute: 0 10*3/uL (ref 0.0–0.1)
Basophils Relative: 0 %
Eosinophils Absolute: 0.1 10*3/uL (ref 0.0–0.7)
Eosinophils Relative: 1 %
HEMATOCRIT: 31.9 % — AB (ref 36.0–46.0)
Hemoglobin: 10.2 g/dL — ABNORMAL LOW (ref 12.0–15.0)
LYMPHS PCT: 13 %
Lymphs Abs: 1.3 10*3/uL (ref 0.7–4.0)
MCH: 30.4 pg (ref 26.0–34.0)
MCHC: 32 g/dL (ref 30.0–36.0)
MCV: 94.9 fL (ref 78.0–100.0)
MONO ABS: 1.3 10*3/uL (ref 0.1–1.0)
MONOS PCT: 13 %
NEUTROS ABS: 7.2 10*3/uL (ref 1.7–7.7)
Neutrophils Relative %: 73 %
Platelets: 211 10*3/uL (ref 150–400)
RBC: 3.36 MIL/uL — ABNORMAL LOW (ref 3.87–5.11)
RDW: 14.5 % (ref 11.5–15.5)
WBC: 10 10*3/uL (ref 4.0–10.5)

## 2017-07-06 LAB — BASIC METABOLIC PANEL
Anion gap: 9 (ref 5–15)
BUN: 18 mg/dL (ref 6–20)
CO2: 25 mmol/L (ref 22–32)
CREATININE: 0.62 mg/dL (ref 0.44–1.00)
Calcium: 8 mg/dL — ABNORMAL LOW (ref 8.9–10.3)
Chloride: 104 mmol/L (ref 101–111)
GFR calc Af Amer: >60 mL/min (ref >60)
GFR calc non Af Amer: >60 mL/min (ref >60)
Glucose, Bld: 100 mg/dL — ABNORMAL HIGH (ref 65–99)
Potassium: 4.3 mmol/L (ref 3.5–5.1)
Sodium: 138 mmol/L (ref 135–145)

## 2017-07-06 MED ORDER — ENOXAPARIN SODIUM 40 MG/0.4ML ~~LOC~~ SOLN
40.0000 mg | SUBCUTANEOUS | Status: DC
  Administered 2017-07-07: 40 mg via SUBCUTANEOUS
  Filled 2017-07-06: qty 0.4

## 2017-07-06 NOTE — Progress Notes (Signed)
PROGRESS NOTE  Dawn Gordon CBJ:[id number] DOB: 1921-02-07 DOA: 07/02/2017 PCP: Reita Cliche, MD  HPI/Recap of past 24 hours: Dawn Gordon is a 84 y.o. female with medical history significant of Alzheimer's dementia and hyperlipidemia who presents with left hip pain. Patient is accompanied by family who reports that she has had some left hip pain for the last few days. X-ray showed a left femoral neck fracture.  Family unaware if patient's ever fell/trauma.  Patient has baseline dementia and unable to give details.  Admitted for further management with orthopedics on consult.  Today, patient looks comfortable, reports pain but doesn't react when examined. Has dementia. Met sister at bedside.    Assessment/Plan: Principal Problem:   Closed left hip fracture, initial encounter [facility]) Active Problems:   Alzheimer's dementia without behavioral disturbance   Displaced fracture of left femoral neck (HCC)  Acute displaced left femoral neck fracture Status post left hip hemiarthroplasty on 07/03/2017 Unknown etiology as patient has dementia, likely mechanical fall Pain management Orthopedics on board: DVT ppx, PT/OT Fall precautions Monitor closely  Acute blood loss anemia Likely 2/2 post op Daily cbc  Dementia Continue Aricept and Namenda     Code Status: Full  Family Communication: Sister at bedside  Disposition Plan: SNF   Consultants:  Orthopedics  Procedures:  Left hip hemiarthroplasty on 07/03/2017  Antimicrobials:  None  DVT prophylaxis: Lovenox   Objective: Vitals:   07/05/17 1458 07/05/17 1943 07/06/17 0559 07/06/17 1343  BP: 107/76 (!) 119/57 131/79 137/79  Pulse: 96 (!) 108 97 100  Resp: _0 Temp: 98.4 F (36.9 C) 99.2 F (37.3 C) 98.7 F (37.1 C) 99.2 F (37.3 C)  TempSrc: Oral Oral Oral Oral  SpO2: (!) 79% 98% 96% 98%  Weight:      Height:        Intake/Output Summary (Last 24 hours) at 07/06/2017 1750 Last data filed at  07/06/2017 1400 Gross per 24 hour  Intake 600 ml  Output 400 ml  Net 200 ml   Filed Weights   07/02/17 2108 07/02/17 2225 07/03/17 0256  Weight: 68 kg (150 lb) 68 kg (150 lb) 72.6 kg (160 lb)    Exam:   General: NAD  Cardiovascular: S1, S2 present  Respiratory: CTA B  Abdomen: Soft, nontender, nondistended, bowel sounds present  Musculoskeletal: No pedal edema bilaterally, L hip dressing C/D/I  Skin: Normal  Psychiatry: Unable to assess   Data Reviewed: CBC: Recent Labs  Lab 07/02/17 2325 07/03/17 1722 07/04/17 0524 07/05/17 0513 07/06/17 0447  WBC 10.6* 14.9* 11.4* 9.8 10.0  NEUTROABS 7.9  --  9.3* 6.9 7.2  HGB 13.7 11.9* 11.4* 10.2* 10.2*  HCT 41.7 37.7 36.5 31.7* 31.9*  MCV 94.1 95.4 95.3 94.6 94.9  PLT PLATELET CLUMPS NOTED ON SMEAR, COUNT APPEARS ADEQUATE 196 201 219 160   Basic Metabolic Panel: Recent Labs  Lab 07/02/17 2325 07/03/17 1722 07/04/17 0524 07/05/17 0513 07/06/17 0447  NA 138  --  137 139 138  K 3.3*  --  4.3 3.3* 4.3  CL 105  --  103 102 104  CO2 22  --  20* 28 25  GLUCOSE 95  --  111* 106* 100*  BUN 17  --  24* 33* 18  CREATININE 0.62 0.71 0.90 0.82 0.62  CALCIUM 8.6*  --  8.5* 8.3* 8.0*   GFR: Estimated Creatinine Clearance: 38.2 mL/min (by C-G formula based on SCr of 0.62 mg/dL). Liver Function Tests: Recent Labs  Lab 07/02/17 2325  AST 16  ALT 11*  ALKPHOS 136*  BILITOT 1.0  PROT 6.8  ALBUMIN 3.5   No results for input(s): LIPASE, AMYLASE in the last 168 hours. No results for input(s): AMMONIA in the last 168 hours. Coagulation Profile: Recent Labs  Lab 07/02/17 2240  INR 1.51   Cardiac Enzymes: No results for input(s): CKTOTAL, CKMB, CKMBINDEX, TROPONINI in the last 168 hours. BNP (last 3 results) No results for input(s): PROBNP in the last 8760 hours. HbA1C: No results for input(s): HGBA1C in the last 72 hours. CBG: No results for input(s): GLUCAP in the last 168 hours. Lipid Profile: No results for  input(s): CHOL, HDL, LDLCALC, TRIG, CHOLHDL, LDLDIRECT in the last 72 hours. Thyroid Function Tests: No results for input(s): TSH, T4TOTAL, FREET4, T3FREE, THYROIDAB in the last 72 hours. Anemia Panel: No results for input(s): VITAMINB12, FOLATE, FERRITIN, TIBC, IRON, RETICCTPCT in the last 72 hours. Urine analysis: No results found for: COLORURINE, APPEARANCEUR, [place], [place], GLUCOSEU, HGBUR, BILIRUBINUR, KETONESUR, PROTEINUR, UROBILINOGEN, NITRITE, LEUKOCYTESUR Sepsis Labs: _0 (procalcitonin:4,lacticidven:4)  ) Recent Results (from the past 240 hour(s))  MRSA PCR Screening     Status: None   Collection Time: 07/03/17  3:31 AM  Result Value Ref Range Status   MRSA by PCR NEGATIVE NEGATIVE Final    Comment:        The GeneXpert MRSA Assay (FDA approved for NASAL specimens only), is one component of a comprehensive MRSA colonization surveillance program. It is not intended to diagnose MRSA infection nor to guide or monitor treatment for MRSA infections. Performed at [facility], [place] [address], [place], [place] [address]       Studies: No results found.  Scheduled Meds: . buPROPion  75 mg Oral BID  . docusate sodium  100 mg Oral BID  . donepezil  10 mg Oral QHS  . [START ON 07/07/2017] enoxaparin (LOVENOX) injection  40 mg Subcutaneous Q24H  . escitalopram  10 mg Oral Daily  . feeding supplement (ENSURE ENLIVE)  237 mL Oral BID BM  . memantine  10 mg Oral BID  . multivitamin   Oral Daily  . simvastatin  40 mg Oral q1800  . traZODone  50 mg Oral QHS    Continuous Infusions: . sodium chloride       LOS: 3 days     Alma Friendly, MD [facility]   If 7PM-7AM, please contact night-coverage [email or web address] Password [email or web address] 07/06/2017, 5:50 PM

## 2017-07-06 NOTE — Progress Notes (Signed)
Met with pt, son, and sister to continue dc planning. Pt's son made list of SNFs by preference. Preferred [facility] or [facility], neither of which expect beds open but advised check back in. Other offer [facility]- son researching this facility and asked that CSW check with him tomorrow for decision. Once facility choice made and open bed confirmed, facility can initiate insurance authorization request. Son understanding.  Sharren Bridge, MSW, LCSW Clinical Social Work 07/06/2017 [phone]

## 2017-07-06 NOTE — Plan of Care (Signed)
Dementia limits patient's understanding of her situation.

## 2017-07-07 LAB — CBC WITH DIFFERENTIAL/PLATELET
BASOS ABS: 0 10*3/uL (ref 0.0–0.1)
BASOS PCT: 0 %
Eosinophils Absolute: 0.1 10*3/uL (ref 0.0–0.7)
Eosinophils Relative: 2 %
HEMATOCRIT: 31.2 % — AB (ref 36.0–46.0)
Hemoglobin: 9.9 g/dL — ABNORMAL LOW (ref 12.0–15.0)
Lymphocytes Relative: 14 %
Lymphs Abs: 1.2 10*3/uL (ref 0.7–4.0)
MCH: 29.8 pg (ref 26.0–34.0)
MCHC: 31.7 g/dL (ref 30.0–36.0)
MCV: 94 fL (ref 78.0–100.0)
Monocytes Absolute: 1.1 10*3/uL — ABNORMAL HIGH (ref 0.1–1.0)
Monocytes Relative: 12 %
NEUTROS ABS: 6.5 10*3/uL (ref 1.7–7.7)
Neutrophils Relative %: 72 %
Platelets: 201 10*3/uL (ref 150–400)
RBC: 3.32 MIL/uL — ABNORMAL LOW (ref 3.87–5.11)
RDW: 14.3 % (ref 11.5–15.5)
WBC: 8.9 10*3/uL (ref 4.0–10.5)

## 2017-07-07 MED ORDER — HYDROCODONE-ACETAMINOPHEN 5-325 MG PO TABS: 1.0000 | ORAL_TABLET | Freq: Four times a day (QID) | ORAL | 0 refills | Status: AC | PRN

## 2017-07-07 MED ORDER — DOCUSATE SODIUM 100 MG PO CAPS: 100.0000 mg | ORAL_CAPSULE | Freq: Two times a day (BID) | ORAL | 0 refills | Status: AC

## 2017-07-07 MED ORDER — ENOXAPARIN SODIUM 40 MG/0.4ML ~~LOC~~ SOLN: 40.0000 mg | SUBCUTANEOUS | Status: DC

## 2017-07-07 NOTE — Clinical Social Work Placement (Addendum)
D/C Summary faxed.  Nurse given number to call report./ Room 108P [facility] arranged for transport for 5:30pick up.    CLINICAL SOCIAL WORK PLACEMENT  NOTE  Date:  07/07/2017  Patient Details  Name: Dawn Gordon MRN: [id number] Date of Birth: Dec 13, 1920  Clinical Social Work is seeking post-discharge placement for this patient at the Skilled  Nursing Facility level of care (*CSW will initial, date and re-position this form in  chart as items are completed):  Yes   Patient/family provided with [facility] Clinical Social Work Department's list of facilities offering this level of care within the geographic area requested by the patient (or if unable, by the patient's family).  Yes   Patient/family informed of their freedom to choose among providers that offer the needed level of care, that participate in Medicare, Medicaid or managed care program needed by the patient, have an available bed and are willing to accept the patient.  Yes   Patient/family informed of [facility]'s ownership interest in [facility] and [facility], as well as of the fact that they are under no obligation to receive care at these facilities.  PASRR submitted to EDS on       PASRR number received on       Existing PASRR number confirmed on       FL2 transmitted to all facilities in geographic area requested by pt/family on       FL2 transmitted to all facilities within larger geographic area on 07/07/17     Patient informed that his/her managed care company has contracts with or will negotiate with certain facilities, including the following:  [facility]     Yes   Patient/family informed of bed offers received.  Patient chooses bed at [facility]     Physician recommends and patient chooses bed at      Patient to be transferred to [facility] on 07/07/17.  Patient to be transferred to facility by [facility]     Patient family notified on 07/07/17 of  transfer.  Name of family member notified:  Daughter in law/ Son-Tim     PHYSICIAN Please prepare priority discharge summary, including medications     Additional Comment:    _______________________________________________ Clearance Coots, LCSW 07/07/2017, 2:40 PM

## 2017-07-07 NOTE — Care Management Important Message (Signed)
Important Message  Patient Details  Name: CLARITY CISZEK MRN: [id number] Date of Birth: 03/30/1920   Medicare Important Message Given:  Yes    Caren Macadam 07/07/2017, 12:15 PMImportant Message  Patient Details  Name: KYNZLEIGH BANDEL MRN: [id number] Date of Birth: Oct 21, 1920   Medicare Important Message Given:  Yes    Caren Macadam 07/07/2017, 12:15 PM

## 2017-07-07 NOTE — Discharge Summary (Addendum)
Discharge Summary  Dawn Gordon ZOX:[id number] DOB: [birth year]  PCP: Brooke Bonito, MD  Admit date: 07/02/2017 Discharge date: 07/07/2017  Time spent: 35 mins  Recommendations for Outpatient Follow-up:  1. PCP 2. Orthopedics  Discharge Diagnoses:  Active Hospital Problems   Diagnosis Date Noted  . Closed left hip fracture, initial encounter (HCC) 07/03/2017  . Alzheimer's dementia without behavioral disturbance 07/03/2017  . Displaced fracture of left femoral neck (HCC) 07/03/2017    Resolved Hospital Problems  No resolved problems to display.    Discharge Condition: Stable  Diet recommendation: Heart healthy  Vitals:   07/06/17 2357 07/07/17 0529  BP: (!) 149/50 119/71  Pulse: 87 85  Resp:  15  Temp:  97.8 F (36.6 C)  SpO2: 95% 96%    History of present illness:  Dawn Gordon a 84 y.o.femalewith medical history significant ofAlzheimer's dementia and hyperlipidemia who presents with left hip pain. Patient is accompanied by family who reports that she has had some left hip pain for the last few days. X-ray showeda left femoral neck fracture.  Family unaware if patient's ever fell/trauma.  Patient has baseline dementia and unable to give details.  Admitted for further management with orthopedics on consult.  Today, patient looks comfortable, denies any pain. Stable for discharge to SNF, with close follow up with orthopedics.   Hospital Course:  Principal Problem:   Closed left hip fracture, initial encounter [facility] Active Problems:   Alzheimer's dementia without behavioral disturbance   Displaced fracture of left femoral neck (HCC)  Acute displaced left femoral neck fracture Status post left hip hemiarthroplasty on 07/03/2017 Unknown etiology as patient has dementia, likely mechanical fall Pain management Orthopedics on board: DVT ppx, PT/OT Fall precautions Monitor closely  Acute blood loss anemia Likely 2/2 post op PCP to follow  up  Dementia Continue Aricept and Namenda  DVT ppx Continue SQ lovenox 40 mg daily for 30 days     Procedures:  Left hip hemiarthroplasty on 07/03/2017  Consultations:  Orthopedics  Discharge Exam: BP 119/71 (BP Location: Right Arm)   Pulse 85   Temp 97.8 F (36.6 C) (Oral)   Resp 15   Ht 5' (1.524 m)   Wt 72.6 kg (160 lb)   SpO2 96%   BMI 31.25 kg/m   General: NAD Cardiovascular: S1, S2 present Respiratory: CTAB   Discharge Instructions You were cared for by a hospitalist during your hospital stay. If you have any questions about your discharge medications or the care you received while you were in the hospital after you are discharged, you can call the unit and asked to speak with the hospitalist on call if the hospitalist that took care of you is not available. Once you are discharged, your primary care physician will handle any further medical issues. Please note that NO REFILLS for any discharge medications will be authorized once you are discharged, as it is imperative that you return to your primary care physician (or establish a relationship with a primary care physician if you do not have one) for your aftercare needs so that they can reassess your need for medications and monitor your lab values.  Discharge Instructions    Diet - low sodium heart healthy   Complete by:  As directed    Increase activity slowly   Complete by:  As directed      Allergies as of 07/07/2017   No Known Allergies     Medication List    STOP taking these medications  traMADol 50 MG tablet Commonly known as:  ULTRAM     TAKE these medications   acetaminophen 500 MG tablet Commonly known as:  TYLENOL Take 1,000 mg by mouth every 8 (eight) hours as needed for mild pain.   buPROPion 75 MG tablet Commonly known as:  WELLBUTRIN Take 75 mg by mouth 2 (two) times daily.   docusate sodium 100 MG capsule Commonly known as:  COLACE Take 1 capsule (100 mg total) by mouth 2 (two)  times daily.   donepezil 10 MG tablet Commonly known as:  ARICEPT Take 10 mg by mouth at bedtime.   escitalopram 10 MG tablet Commonly known as:  LEXAPRO Take 10 mg by mouth daily.   HYDROcodone-acetaminophen 5-325 MG tablet Commonly known as:  NORCO/VICODIN Take 1 tablet by mouth every 6 (six) hours as needed for up to 7 days for moderate pain or severe pain.   meclizine 12.5 MG tablet Commonly known as:  ANTIVERT Take 12.5 mg by mouth 3 (three) times daily as needed for dizziness.   memantine 10 MG tablet Commonly known as:  NAMENDA Take 10 mg by mouth 2 (two) times daily.   polyvinyl alcohol 1.4 % ophthalmic solution Commonly known as:  LIQUIFILM TEARS Place 1 drop into both eyes 3 (three) times daily as needed for dry eyes.   PRESERVISION AREDS PO Take 1 tablet by mouth daily.   simvastatin 40 MG tablet Commonly known as:  ZOCOR Take 40 mg by mouth daily.   traZODone 50 MG tablet Commonly known as:  DESYREL Take 50 mg by mouth at bedtime.   triamcinolone cream 0.1 % Commonly known as:  KENALOG Apply 1 application topically as directed. Every 24 hours as needed for itching   VITAMIN B-12 PO Take 1 tablet by mouth daily.      No Known Allergies  Contact information for follow-up providers    Swinteck, Arlys John, MD. Schedule an appointment as soon as possible for a visit in 2 weeks.   Specialty:  Orthopedic Surgery Why:  For wound re-check Contact information: [address] [place] [address] [phone]            Contact information for after-discharge care    Destination    [facility] .   Service:  Skilled Nursing Contact information: [address] [place] [address] [phone]                   The results of significant diagnostics from this hospitalization (including imaging, microbiology, ancillary and laboratory) are listed below for reference.    Significant Diagnostic  Studies: Dg Chest 1 View  Result Date: 07/02/2017 CLINICAL DATA:  Hip fracture EXAM: CHEST  1 VIEW COMPARISON:  06/13/2016 FINDINGS: Mildly low lung volumes. Mild diffuse coarse interstitial opacity felt consistent with chronic change. No acute consolidation. Mild cardiomegaly. Aortic atherosclerosis. No pneumothorax. IMPRESSION: No active disease.  Borderline to mild cardiomegaly. Electronically Signed   By: Jasmine Pang M.D.   On: 07/02/2017 23:24   Dg Knee 1-2 Views Left  Result Date: 07/03/2017 CLINICAL DATA:  84 year old female with left hip fracture. Prior left knee replacement. EXAM: LEFT KNEE - 1-2 VIEW COMPARISON:  Left hip radiograph dated 07/02/2017 FINDINGS: There is a total knee arthroplasty. The arthroplasty components appear intact and in anatomic alignment. No evidence of loosening. The bones are osteopenic. There is no acute fracture or dislocation. No joint effusion. The soft tissues appear unremarkable. IMPRESSION: 1. No  acute fracture or dislocation. 2. Osteopenia. 3. Total knee arthroplasty appears intact. Electronically Signed   By: Elgie Collard M.D.   On: 07/03/2017 00:49   Pelvis Portable  Result Date: 07/03/2017 CLINICAL DATA:  Status post left hip arthroplasty EXAM: PORTABLE PELVIS 1-2 VIEWS COMPARISON:  Intraoperative fluoroscopic radiographs dated 07/03/2017 FINDINGS: Left hip hemiarthroplasty in satisfactory position. Mild degenerative changes of the right hip. Visualized bony pelvis appears intact. IMPRESSION: Left hip hemiarthroplasty in satisfactory position. Electronically Signed   By: Charline Bills M.D.   On: 07/03/2017 18:23   Dg C-arm 1-60 Min-no Report  Result Date: 07/03/2017 Fluoroscopy was utilized by the requesting physician.  No radiographic interpretation.   Dg Hip Operative Unilat With Pelvis Left  Result Date: 07/03/2017 CLINICAL DATA:  Left hip replacement. EXAM: OPERATIVE LEFT HIP (WITH PELVIS IF PERFORMED) TECHNIQUE: Fluoroscopic spot image(s)  were submitted for interpretation post-operatively. FLUOROSCOPY TIME:  9 seconds. C-arm fluoroscopic images were obtained intraoperatively and submitted for post operative interpretation. COMPARISON:  Left hip x-rays from yesterday. FINDINGS: Intraoperative x-rays demonstrate interval left hip hemiarthroplasty. Alignment is normal. No acute abnormality. IMPRESSION: Interval left hip hemiarthroplasty. Electronically Signed   By: Obie Dredge M.D.   On: 07/03/2017 15:39   Dg Hip Unilat W Or Wo Pelvis 2-3 Views Left  Result Date: 07/02/2017 CLINICAL DATA:  Unwitnessed fall EXAM: DG HIP (WITH OR WITHOUT PELVIS) 2-3V LEFT COMPARISON:  None. FINDINGS: Mild SI joint degenerative changes. Pubic symphysis and rami are intact. Both femoral heads project in joint. Acute left femoral neck fracture with cephalad migration of the trochanter. Left femoral head projects in joint. Multiple phleboliths in the pelvis. IMPRESSION: Acute displaced left femoral neck fracture Electronically Signed   By: Jasmine Pang M.D.   On: 07/02/2017 23:23    Microbiology: Recent Results (from the past 240 hour(s))  MRSA PCR Screening     Status: None   Collection Time: 07/03/17  3:31 AM  Result Value Ref Range Status   MRSA by PCR NEGATIVE NEGATIVE Final    Comment:        The GeneXpert MRSA Assay (FDA approved for NASAL specimens only), is one component of a comprehensive MRSA colonization surveillance program. It is not intended to diagnose MRSA infection nor to guide or monitor treatment for MRSA infections. Performed at [facility], [address] [place], [place] [address]      Labs: Basic Metabolic Panel: Recent Labs  Lab 07/02/17 2325 07/03/17 1722 07/04/17 0524 07/05/17 0513 07/06/17 0447  NA 138  --  137 139 138  K 3.3*  --  4.3 3.3* 4.3  CL 105  --  103 102 104  CO2 22  --  20* 28 25  GLUCOSE 95  --  111* 106* 100*  BUN 17  --  24* 33* 18  CREATININE 0.62 0.71 0.90 0.82 0.62   CALCIUM 8.6*  --  8.5* 8.3* 8.0*   Liver Function Tests: Recent Labs  Lab 07/02/17 2325  AST 16  ALT 11*  ALKPHOS 136*  BILITOT 1.0  PROT 6.8  ALBUMIN 3.5   No results for input(s): LIPASE, AMYLASE in the last 168 hours. No results for input(s): AMMONIA in the last 168 hours. CBC: Recent Labs  Lab 07/02/17 2325 07/03/17 1722 07/04/17 0524 07/05/17 0513 07/06/17 0447 07/07/17 0450  WBC 10.6* 14.9* 11.4* 9.8 10.0 8.9  NEUTROABS 7.9  --  9.3* 6.9 7.2 6.5  HGB 13.7 11.9* 11.4* 10.2* 10.2* 9.9*  HCT 41.7  37.7 36.5 31.7* 31.9* 31.2*  MCV 94.1 95.4 95.3 94.6 94.9 94.0  PLT PLATELET CLUMPS NOTED ON SMEAR, COUNT APPEARS ADEQUATE 196 201 219 211 201   Cardiac Enzymes: No results for input(s): CKTOTAL, CKMB, CKMBINDEX, TROPONINI in the last 168 hours. BNP: BNP (last 3 results) No results for input(s): BNP in the last 8760 hours.  ProBNP (last 3 results) No results for input(s): PROBNP in the last 8760 hours.  CBG: No results for input(s): GLUCAP in the last 168 hours.     Signed:  Briant Cedar, MD [facility] 07/07/2017, 2:34 PM

## 2017-07-08 ENCOUNTER — Encounter: Payer: Self-pay | Admitting: Internal Medicine

## 2017-07-08 ENCOUNTER — Non-Acute Institutional Stay (SKILLED_NURSING_FACILITY): Payer: Medicare Other | Admitting: Internal Medicine

## 2017-07-08 DIAGNOSIS — E785 Hyperlipidemia, unspecified: Secondary | ICD-10-CM | POA: Diagnosis not present

## 2017-07-08 DIAGNOSIS — D62 Acute posthemorrhagic anemia: Secondary | ICD-10-CM | POA: Diagnosis not present

## 2017-07-08 DIAGNOSIS — G301 Alzheimer's disease with late onset: Secondary | ICD-10-CM | POA: Diagnosis not present

## 2017-07-08 DIAGNOSIS — F0280 Dementia in other diseases classified elsewhere without behavioral disturbance: Secondary | ICD-10-CM

## 2017-07-08 DIAGNOSIS — F329 Major depressive disorder, single episode, unspecified: Secondary | ICD-10-CM

## 2017-07-08 DIAGNOSIS — S72002A Fracture of unspecified part of neck of left femur, initial encounter for closed fracture: Secondary | ICD-10-CM | POA: Diagnosis not present

## 2017-07-08 DIAGNOSIS — R42 Dizziness and giddiness: Secondary | ICD-10-CM | POA: Diagnosis not present

## 2017-07-08 DIAGNOSIS — Z96642 Presence of left artificial hip joint: Secondary | ICD-10-CM | POA: Diagnosis not present

## 2017-07-08 DIAGNOSIS — F32A Depression, unspecified: Secondary | ICD-10-CM

## 2017-07-08 NOTE — Progress Notes (Signed)
: Provider:  Randon Goldsmith. Lyn Hollingshead, MD Location:  [facility] Room Number: 108P Place of Service:  SNF ([phone])  PCP: Brooke Bonito, MD Patient Care Team: Brooke Bonito, MD as PCP - General (Internal Medicine)  Extended Emergency Contact Information Primary Emergency Contact: Lurline Del Address: [address] [place], [place] [address] [place] Home Phone: [phone] Relation: Son     Allergies: Patient has no known allergies.  Chief Complaint  Patient presents with  . New Admit To SNF    HPI: Patient is 84 y.o. female with history of Alzheimer's dementia and hyperlipidemia who presented to [facility], ED with left hip pain.  Patient was currently by her family who reports that she had left hip pain for the last 3 days.  Reportedly 3 days ago she had a normal hip x-ray at her doctor's office, however she is Dr. again today who ordered a repeat x-ray which showed a left femoral neck fracture not seen 3 days ago.  Patient was therefore sent to the ED.  Patient was not able to describe any fall and the staff at the facility had did not tell the son about any recent trauma.  There is no evidence of trauma other than her hip.  She denied any headaches neck pain chest pain back pain or abdominal pain.  Patient was admitted to [facility] long hospital from 5/2-7 where patient underwent a hip hemiarthroplasty on 5/3.  Patient had acute blood loss anemia postop which will be followed.  Patient is to be on subcu Lovenox for 30 days.  Patient is admitted to skilled nursing facility for OT/PT.  While at skilled nursing facility patient will be followed for her dementia treated with Aricept and Namenda, depression treated with Lexapro and Wellbutrin, and vertigo treated with as needed meclizine.  Past Medical History:  Diagnosis Date  . Amnesia   . H/O fracture of left hip 07/03/2017  . Hyperlipidemia   . Overactive bladder   . Sleep apnea      Past Surgical History:  Procedure Laterality Date  . ANKLE SURGERY    . ANTERIOR APPROACH HEMI HIP ARTHROPLASTY Left 07/03/2017   Procedure: ANTERIOR APPROACH HEMI HIP ARTHROPLASTY;  Surgeon: Samson Frederic, MD;  Location: [facility]S;  Service: Orthopedics;  Laterality: Left;  . BACK SURGERY    . KNEE SURGERY      Allergies as of 07/08/2017   No Known Allergies     Medication List        Accurate as of 07/08/17 12:19 PM. Always use your most recent med list.          acetaminophen 500 MG tablet Commonly known as:  TYLENOL Take 1,000 mg by mouth every 8 (eight) hours as needed for mild pain.   buPROPion 75 MG tablet Commonly known as:  WELLBUTRIN Take 75 mg by mouth 2 (two) times daily.   docusate sodium 100 MG capsule Commonly known as:  COLACE Take 1 capsule (100 mg total) by mouth 2 (two) times daily.   donepezil 10 MG tablet Commonly known as:  ARICEPT Take 10 mg by mouth at bedtime.   enoxaparin 40 MG/0.4ML injection Commonly known as:  LOVENOX Inject 0.4 mLs (40 mg total) into the skin daily.   escitalopram 10 MG tablet Commonly known as:  LEXAPRO Take 10 mg by mouth daily.   HYDROcodone-acetaminophen 5-325 MG tablet Commonly known as:  NORCO/VICODIN  Take 1 tablet by mouth every 6 (six) hours as needed for up to 7 days for moderate pain or severe pain.   meclizine 12.5 MG tablet Commonly known as:  ANTIVERT Take 12.5 mg by mouth 3 (three) times daily as needed for dizziness.   memantine 10 MG tablet Commonly known as:  NAMENDA Take 10 mg by mouth 2 (two) times daily.   polyvinyl alcohol 1.4 % ophthalmic solution Commonly known as:  LIQUIFILM TEARS Place 1 drop into both eyes 3 (three) times daily as needed for dry eyes.   PRESERVISION AREDS PO Take 1 tablet by mouth daily.   simvastatin 40 MG tablet Commonly known as:  ZOCOR Take 40 mg by mouth daily.   traZODone 50 MG tablet Commonly known as:  DESYREL Take 50 mg by mouth at bedtime.    triamcinolone cream 0.1 % Commonly known as:  KENALOG Apply 1 application topically as directed. Every 24 hours as needed for itching   VITAMIN B-12 PO Take 500 mcg by mouth daily.       No orders of the defined types were placed in this encounter.    There is no immunization history on file for this patient.  Social History   Tobacco Use  . Smoking status: Never Smoker  . Smokeless tobacco: Never Used  Substance Use Topics  . Alcohol use: No    Family history is   Family History  Family history unknown: Yes      Review of Systems  DATA OBTAINED: from patient, nurse GENERAL:  no fevers, fatigue, appetite changes SKIN: No itching, or rash EYES: No eye pain, redness, discharge EARS: No earache, tinnitus, change in hearing NOSE: No congestion, drainage or bleeding  MOUTH/THROAT: No mouth or tooth pain, No sore throat RESPIRATORY: No cough, wheezing, SOB CARDIAC: No chest pain, palpitations, lower extremity edema  GI: No abdominal pain, No N/V/D or constipation, No heartburn or reflux  GU: No dysuria, frequency or urgency, or incontinence  MUSCULOSKELETAL: No unrelieved bone/joint pain NEUROLOGIC: No headache, dizziness or focal weakness PSYCHIATRIC: No c/o anxiety or sadness   Vitals:   07/08/17 1213  BP: 126/82  Pulse: (!) 103  Resp: (!) 22  Temp: 98.5 F (36.9 C)  SpO2: 93%    SpO2 Readings from Last 1 Encounters:  07/08/17 93%   Body mass index is 31.25 kg/m.     Physical Exam  GENERAL APPEARANCE: Alert,   No acute distress.  SKIN: No diaphoresis rash HEAD: Normocephalic, atraumatic  EYES: Conjunctiva/lids clear. Pupils round, reactive. EOMs intact.  EARS: External exam WNL, canals clear. Hearing grossly normal.  NOSE: No deformity or discharge.  MOUTH/THROAT: Lips w/o lesions  RESPIRATORY: Breathing is even, unlabored. Lung sounds are clear   CARDIOVASCULAR: Heart RRR no murmurs, rubs or gallops. No peripheral edema.   GASTROINTESTINAL:  Abdomen is soft, non-tender, not distended w/ normal bowel sounds. GENITOURINARY: Bladder non tender, not distended  MUSCULOSKELETAL: No unexpected swelling or redness to left hip and leg NEUROLOGIC:  Cranial nerves 2-12 grossly intact. Moves all extremities  PSYCHIATRIC: Mood and affect appropriate with dementia, no behavioral issues  Patient Active Problem List   Diagnosis Date Noted  . Closed left hip fracture, initial encounter (HCC) 07/03/2017  . Alzheimer's dementia without behavioral disturbance 07/03/2017  . Displaced fracture of left femoral neck (HCC) 07/03/2017      Labs reviewed: Basic Metabolic Panel:    Component Value Date/Time   NA 138 07/06/2017 0447   K 4.3 07/06/2017  0447   CL 104 07/06/2017 0447   CO2 25 07/06/2017 0447   GLUCOSE 100 (H) 07/06/2017 0447   BUN 18 07/06/2017 0447   CREATININE 0.62 07/06/2017 0447   CALCIUM 8.0 (L) 07/06/2017 0447   PROT 6.8 07/02/2017 2325   ALBUMIN 3.5 07/02/2017 2325   AST 16 07/02/2017 2325   ALT 11 (L) 07/02/2017 2325   ALKPHOS 136 (H) 07/02/2017 2325   BILITOT 1.0 07/02/2017 2325   GFRNONAA >60 07/06/2017 0447   GFRAA >60 07/06/2017 0447    Recent Labs    07/04/17 0524 07/05/17 0513 07/06/17 0447  NA 137 139 138  K 4.3 3.3* 4.3  CL 103 102 104  CO2 20* 28 25  GLUCOSE 111* 106* 100*  BUN 24* 33* 18  CREATININE 0.90 0.82 0.62  CALCIUM 8.5* 8.3* 8.0*   Liver Function Tests: Recent Labs    07/02/17 2325  AST 16  ALT 11*  ALKPHOS 136*  BILITOT 1.0  PROT 6.8  ALBUMIN 3.5   No results for input(s): LIPASE, AMYLASE in the last 8760 hours. No results for input(s): AMMONIA in the last 8760 hours. CBC: Recent Labs    07/05/17 0513 07/06/17 0447 07/07/17 0450  WBC 9.8 10.0 8.9  NEUTROABS 6.9 7.2 6.5  HGB 10.2* 10.2* 9.9*  HCT 31.7* 31.9* 31.2*  MCV 94.6 94.9 94.0  PLT 219 211 201   Lipid No results for input(s): CHOL, HDL, LDLCALC, TRIG in the last 8760 hours.  Cardiac Enzymes: No results for  input(s): CKTOTAL, CKMB, CKMBINDEX, TROPONINI in the last 8760 hours. BNP: No results for input(s): BNP in the last 8760 hours. No results found for: MICROALBUR No results found for: HGBA1C No results found for: TSH No results found for: VITAMINB12 No results found for: FOLATE No results found for: IRON, TIBC, FERRITIN  Imaging and Procedures obtained prior to SNF admission: Dg Chest 1 View  Result Date: 07/02/2017 CLINICAL DATA:  Hip fracture EXAM: CHEST  1 VIEW COMPARISON:  06/13/2016 FINDINGS: Mildly low lung volumes. Mild diffuse coarse interstitial opacity felt consistent with chronic change. No acute consolidation. Mild cardiomegaly. Aortic atherosclerosis. No pneumothorax. IMPRESSION: No active disease.  Borderline to mild cardiomegaly. Electronically Signed   By: Jasmine Pang M.D.   On: 07/02/2017 23:24   Dg Knee 1-2 Views Left  Result Date: 07/03/2017 CLINICAL DATA:  84 year old female with left hip fracture. Prior left knee replacement. EXAM: LEFT KNEE - 1-2 VIEW COMPARISON:  Left hip radiograph dated 07/02/2017 FINDINGS: There is a total knee arthroplasty. The arthroplasty components appear intact and in anatomic alignment. No evidence of loosening. The bones are osteopenic. There is no acute fracture or dislocation. No joint effusion. The soft tissues appear unremarkable. IMPRESSION: 1. No acute fracture or dislocation. 2. Osteopenia. 3. Total knee arthroplasty appears intact. Electronically Signed   By: Elgie Collard M.D.   On: 07/03/2017 00:49   Pelvis Portable  Result Date: 07/03/2017 CLINICAL DATA:  Status post left hip arthroplasty EXAM: PORTABLE PELVIS 1-2 VIEWS COMPARISON:  Intraoperative fluoroscopic radiographs dated 07/03/2017 FINDINGS: Left hip hemiarthroplasty in satisfactory position. Mild degenerative changes of the right hip. Visualized bony pelvis appears intact. IMPRESSION: Left hip hemiarthroplasty in satisfactory position. Electronically Signed   By: Charline Bills M.D.   On: 07/03/2017 18:23   Dg C-arm 1-60 Min-no Report  Result Date: 07/03/2017 Fluoroscopy was utilized by the requesting physician.  No radiographic interpretation.   Dg Hip Operative Unilat With Pelvis Left  Result Date: 07/03/2017  CLINICAL DATA:  Left hip replacement. EXAM: OPERATIVE LEFT HIP (WITH PELVIS IF PERFORMED) TECHNIQUE: Fluoroscopic spot image(s) were submitted for interpretation post-operatively. FLUOROSCOPY TIME:  9 seconds. C-arm fluoroscopic images were obtained intraoperatively and submitted for post operative interpretation. COMPARISON:  Left hip x-rays from yesterday. FINDINGS: Intraoperative x-rays demonstrate interval left hip hemiarthroplasty. Alignment is normal. No acute abnormality. IMPRESSION: Interval left hip hemiarthroplasty. Electronically Signed   By: Obie Dredge M.D.   On: 07/03/2017 15:39   Dg Hip Unilat W Or Wo Pelvis 2-3 Views Left  Result Date: 07/02/2017 CLINICAL DATA:  Unwitnessed fall EXAM: DG HIP (WITH OR WITHOUT PELVIS) 2-3V LEFT COMPARISON:  None. FINDINGS: Mild SI joint degenerative changes. Pubic symphysis and rami are intact. Both femoral heads project in joint. Acute left femoral neck fracture with cephalad migration of the trochanter. Left femoral head projects in joint. Multiple phleboliths in the pelvis. IMPRESSION: Acute displaced left femoral neck fracture Electronically Signed   By: Jasmine Pang M.D.   On: 07/02/2017 23:23     Not all labs, radiology exams or other studies done during hospitalization come through on my EPIC note; however they are reviewed by me.    Assessment and Plan  Acute displaced leftp femoral neck fracture/status post left hip hemiarthroplasty- DVT prophylaxis with Lovenox 40 mg daily30 days for 30 days SNF -admitted for OT/PT; will continue Lovenox 40 mg daily for 30 days as DVT prophylaxis with Norco 5 mg every 6 as needed x2 weeks for pain  Acute blood loss anemia postop  SNF -patient's hemoglobin  dropped from 13.7-9.9; no transfusion was needed; will follow up CBC  Dementia SNF -stable; will continue Namenda 10 mg p.o. twice daily and Aricept 10 mg nightly  Depression SNF -10 mg daily and Wellbutrin 75 mg twice daily  Vertigo SNF -meclizine 12.5 mg 3 times daily as needed  Hyperlipidemia SNF -no status uncontrolled; continue Zocor 40 mg daily   Time spent greater than 40 minutes;> 50% of time with patient was spent reviewing records, labs, tests and studies, counseling and developing plan of care  Thurston Hole D. Lyn Hollingshead, MD

## 2017-07-10 NOTE — Progress Notes (Signed)
  PT NOTE     07/06/17 1440  Subjective Data  Subjective assisted OOB to [facility] via "Bear Hug" standpivot sit as pt was unable to self perform due to pain grimacing and Hx dementia.  Assisted with hygiene then back to bed.  Positioned to comfort.   Restrictions  Weight Bearing Restrictions No  LLE Weight Bearing WBAT  Pain Assessment  Pain Assessment No/denies pain  Cognition  Arousal/Alertness Awake/alert  Behavior During Therapy WFL for tasks assessed/performed  Overall Cognitive Status Within Functional Limits for tasks assessed  Bed Mobility  Overal bed mobility Needs Assistance  Bed Mobility Supine to Sit;Sit to Supine  Supine to sit Max assist  Sit to supine  (75% VC's for direction and to increase self participation   )  Transfers  Overall transfer level Needs assistance  Transfers Stand Pivot Transfers  Stand pivot transfers  [facility]" 1/4 turn from bed to [facility] then back to bed)  Ambulation/Gait  Ambulation/Gait assistance  (non amb transfers only)  PT - End of Session  Patient left in bed   PT - Assessment/Plan  PT Plan Current plan remains appropriate  Follow Up Recommendations SNF  AM-PAC PT "6 Clicks" Daily Activity Outcome Measure  Difficulty turning over in bed (including adjusting bedclothes, sheets and blankets)? 1  Difficulty moving from lying on back to sitting on the side of the bed?  1  Difficulty sitting down on and standing up from a chair with arms (e.g., wheelchair, bedside commode, etc,.)? 1  Help needed moving to and from a bed to chair (including a wheelchair)? 1  Help needed walking in hospital room? 1  6 Click Score 5  Mobility G Code  CN  PT Time Calculation  PT Start Time (ACUTE ONLY) 1418  PT Stop Time (ACUTE ONLY) 1440  PT Time Calculation (min) (ACUTE ONLY) 22 min  PT General Charges  $$ ACUTE PT VISIT 1 Visit   Late entry Felecia Shelling  PTA [facility]  Acute  Rehab Pager      [phone]

## 2017-07-11 ENCOUNTER — Encounter: Payer: Self-pay | Admitting: Internal Medicine

## 2017-07-11 DIAGNOSIS — E785 Hyperlipidemia, unspecified: Secondary | ICD-10-CM | POA: Insufficient documentation

## 2017-07-11 DIAGNOSIS — F329 Major depressive disorder, single episode, unspecified: Secondary | ICD-10-CM | POA: Insufficient documentation

## 2017-07-11 DIAGNOSIS — D62 Acute posthemorrhagic anemia: Secondary | ICD-10-CM | POA: Insufficient documentation

## 2017-07-11 DIAGNOSIS — R42 Dizziness and giddiness: Secondary | ICD-10-CM | POA: Insufficient documentation

## 2017-07-11 DIAGNOSIS — F32A Depression, unspecified: Secondary | ICD-10-CM | POA: Insufficient documentation

## 2017-07-11 DIAGNOSIS — Z96642 Presence of left artificial hip joint: Secondary | ICD-10-CM | POA: Insufficient documentation

## 2017-07-23 ENCOUNTER — Non-Acute Institutional Stay (SKILLED_NURSING_FACILITY): Payer: Medicare Other | Admitting: Internal Medicine

## 2017-07-23 DIAGNOSIS — T8149XA Infection following a procedure, other surgical site, initial encounter: Secondary | ICD-10-CM | POA: Diagnosis not present

## 2017-07-24 ENCOUNTER — Encounter: Payer: Self-pay | Admitting: Internal Medicine

## 2017-07-24 NOTE — Progress Notes (Signed)
Opened in error

## 2017-07-24 NOTE — Progress Notes (Signed)
Provider: Randon Goldsmith. Lyn Hollingshead, M.D. [facility] Room Number: 108-P Place of Service:  SNF (31)  Brooke Bonito, MD  Patient Care Team: Brooke Bonito, MD as PCP - General (Internal Medicine)  Extended Emergency Contact Information Primary Emergency Contact: Kopischke,TIM Address: [address] [place], [place] [address] [place] Home Phone: [phone] Relation: Son    Allergies: Patient has no known allergies.  Chief Complaint  Patient presents with  . Acute Visit    Left hip infection at incision site     HPI: Patient is 84 y.o. female who wound care nurse asked me to see for possible incision infection.  This was noted yesterday to be mild redness and today with some drainage.  There is no fever, chills, nausea, vomiting or any other systemic symptom.  Patient does admit is more painful than it has been.  Past Medical History:  Diagnosis Date  . Amnesia   . H/O fracture of left hip 07/03/2017  . Hyperlipidemia   . Overactive bladder   . Sleep apnea     Past Surgical History:  Procedure Laterality Date  . ANKLE SURGERY    . ANTERIOR APPROACH HEMI HIP ARTHROPLASTY Left 07/03/2017   Procedure: ANTERIOR APPROACH HEMI HIP ARTHROPLASTY;  Surgeon: Samson Frederic, MD;  Location: [facility];  Service: Orthopedics;  Laterality: Left;  . BACK SURGERY    . KNEE SURGERY      Allergies as of 07/23/2017   No Known Allergies     Medication List        Accurate as of 07/23/17 11:59 PM. Always use your most recent med list.          acetaminophen 325 MG tablet Commonly known as:  TYLENOL Take 650 mg by mouth every 8 (eight) hours as needed for mild pain.   buPROPion 75 MG tablet Commonly known as:  WELLBUTRIN Take 75 mg by mouth 2 (two) times daily.   docusate sodium 100 MG capsule Commonly known as:  COLACE Take 1 capsule (100 mg total) by mouth 2 (two) times daily.   donepezil 10 MG  tablet Commonly known as:  ARICEPT Take 10 mg by mouth at bedtime.   enoxaparin 40 MG/0.4ML injection Commonly known as:  LOVENOX Inject 0.4 mLs (40 mg total) into the skin daily.   escitalopram 5 MG tablet Commonly known as:  LEXAPRO Take 5 mg by mouth daily. Along with 10 mg tablet totaling 15 mg daily for depression   escitalopram 10 MG tablet Commonly known as:  LEXAPRO Take 10 mg by mouth daily. Along with 5 mg tablet for a total of 15 mg daily   meclizine 12.5 MG tablet Commonly known as:  ANTIVERT Take 12.5 mg by mouth 3 (three) times daily as needed for dizziness.   memantine 10 MG tablet Commonly known as:  NAMENDA Take 10 mg by mouth 2 (two) times daily.   polyvinyl alcohol 1.4 % ophthalmic solution Commonly known as:  LIQUIFILM TEARS Place 1 drop into both eyes 3 (three) times daily as needed for dry eyes.   PRESERVISION AREDS PO Take 1 tablet by mouth daily.   simvastatin 40 MG tablet Commonly known as:  ZOCOR Take 40 mg by mouth daily.   traZODone 50 MG tablet Commonly known as:  DESYREL Take 50 mg by mouth at bedtime.   triamcinolone cream 0.1 % Commonly known as:  KENALOG  Apply 1 application topically as directed. Every 24 hours as needed for itching   UNABLE TO FIND Med Name: Medpass two times daily for weight and wound healing   VITAMIN B-12 PO Take 500 mcg by mouth daily.       No orders of the defined types were placed in this encounter.   Immunization History  Administered Date(s) Administered  . Influenza, High Dose Seasonal PF 11/13/2014, 12/13/2015, 12/07/2016  . PPD Test 11/20/2015  . Pneumococcal Conjugate-13 09/12/2013  . Pneumococcal Polysaccharide-23 01/15/1998  . Td 08/24/2007  . Zoster 01/26/2008    Social History   Tobacco Use  . Smoking status: Never Smoker  . Smokeless tobacco: Never Used  Substance Use Topics  . Alcohol use: No    Review of Systems  DATA OBTAINED: from patient, nurse-as per history present  illness, GENERAL:  no fevers, fatigue, appetite changes SKIN: Redness to incision site also HEENT: No complaint RESPIRATORY: No cough, wheezing, SOB CARDIAC: No chest pain, palpitations, lower extremity edema  GI: No abdominal pain, No N/V/D or constipation, No heartburn or reflux  GU: No dysuria, frequency or urgency, or incontinence  MUSCULOSKELETAL: No unrelieved bone/joint pain NEUROLOGIC: No headache, dizziness  PSYCHIATRIC: No overt anxiety or sadness  Vitals:   07/23/17 1123  BP: 110/64  Pulse: 69  Resp: 20  Temp: 98 F (36.7 C)   There is no height or weight on file to calculate BMI. Physical Exam  GENERAL APPEARANCE: Alert,  No acute distress  SKIN: incision is wider than it should be and interior is wet, edges with some redness and swelling HEENT: Unremarkable RESPIRATORY: Breathing is even, unlabored. Lung sounds are clear   CARDIOVASCULAR: Heart RRR no murmurs, rubs or gallops. No peripheral edema  GASTROINTESTINAL: Abdomen is soft, non-tender, not distended w/ normal bowel sounds.  GENITOURINARY: Bladder non tender, not distended  MUSCULOSKELETAL: No abnormal joints or musculature NEUROLOGIC: Cranial nerves 2-12 grossly intact. Moves all extremities PSYCHIATRIC: Mood and affect appropriate with dementia, no behavioral issues  Patient Active Problem List   Diagnosis Date Noted  . H/O total hip arthroplasty, left 07/11/2017  . Acute blood loss as cause of postoperative anemia 07/11/2017  . Hyperlipidemia 07/11/2017  . Depression 07/11/2017  . Vertigo 07/11/2017  . Closed left hip fracture, initial encounter (HCC) 07/03/2017  . Alzheimer's dementia without behavioral disturbance 07/03/2017  . Displaced fracture of left femoral neck (HCC) 07/03/2017    CMP     Component Value Date/Time   NA 138 07/06/2017 0447   K 4.3 07/06/2017 0447   CL 104 07/06/2017 0447   CO2 25 07/06/2017 0447   GLUCOSE 100 (H) 07/06/2017 0447   BUN 18 07/06/2017 0447   CREATININE  0.62 07/06/2017 0447   CALCIUM 8.0 (L) 07/06/2017 0447   PROT 6.8 07/02/2017 2325   ALBUMIN 3.5 07/02/2017 2325   AST 16 07/02/2017 2325   ALT 11 (L) 07/02/2017 2325   ALKPHOS 136 (H) 07/02/2017 2325   BILITOT 1.0 07/02/2017 2325   GFRNONAA >60 07/06/2017 0447   GFRAA >60 07/06/2017 0447   Recent Labs    07/04/17 0524 07/05/17 0513 07/06/17 0447  NA 137 139 138  K 4.3 3.3* 4.3  CL 103 102 104  CO2 20* 28 25  GLUCOSE 111* 106* 100*  BUN 24* 33* 18  CREATININE 0.90 0.82 0.62  CALCIUM 8.5* 8.3* 8.0*   Recent Labs    07/02/17 2325  AST 16  ALT 11*  ALKPHOS 136*  BILITOT 1.0  PROT 6.8  ALBUMIN 3.5   Recent Labs    07/05/17 0513 07/06/17 0447 07/07/17 0450  WBC 9.8 10.0 8.9  NEUTROABS 6.9 7.2 6.5  HGB 10.2* 10.2* 9.9*  HCT 31.7* 31.9* 31.2*  MCV 94.6 94.9 94.0  PLT 219 211 201   No results for input(s): CHOL, LDLCALC, TRIG in the last 8760 hours.  Invalid input(s): HCL No results found for: MICROALBUR No results found for: TSH No results found for: HGBA1C No results found for: CHOL, HDL, LDLCALC, LDLDIRECT, TRIG, CHOLHDL  Significant Diagnostic Results in last 30 days:  Dg Chest 1 View  Result Date: 07/02/2017 CLINICAL DATA:  Hip fracture EXAM: CHEST  1 VIEW COMPARISON:  06/13/2016 FINDINGS: Mildly low lung volumes. Mild diffuse coarse interstitial opacity felt consistent with chronic change. No acute consolidation. Mild cardiomegaly. Aortic atherosclerosis. No pneumothorax. IMPRESSION: No active disease.  Borderline to mild cardiomegaly. Electronically Signed   By: Jasmine Pang M.D.   On: 07/02/2017 23:24   Dg Knee 1-2 Views Left  Result Date: 07/03/2017 CLINICAL DATA:  84 year old female with left hip fracture. Prior left knee replacement. EXAM: LEFT KNEE - 1-2 VIEW COMPARISON:  Left hip radiograph dated 07/02/2017 FINDINGS: There is a total knee arthroplasty. The arthroplasty components appear intact and in anatomic alignment. No evidence of loosening. The  bones are osteopenic. There is no acute fracture or dislocation. No joint effusion. The soft tissues appear unremarkable. IMPRESSION: 1. No acute fracture or dislocation. 2. Osteopenia. 3. Total knee arthroplasty appears intact. Electronically Signed   By: Elgie Collard M.D.   On: 07/03/2017 00:49   Pelvis Portable  Result Date: 07/03/2017 CLINICAL DATA:  Status post left hip arthroplasty EXAM: PORTABLE PELVIS 1-2 VIEWS COMPARISON:  Intraoperative fluoroscopic radiographs dated 07/03/2017 FINDINGS: Left hip hemiarthroplasty in satisfactory position. Mild degenerative changes of the right hip. Visualized bony pelvis appears intact. IMPRESSION: Left hip hemiarthroplasty in satisfactory position. Electronically Signed   By: Charline Bills M.D.   On: 07/03/2017 18:23   Dg C-arm 1-60 Min-no Report  Result Date: 07/03/2017 CLINICAL DATA:  Left hip replacement. EXAM: OPERATIVE LEFT HIP (WITH PELVIS IF PERFORMED) TECHNIQUE: Fluoroscopic spot image(s) were submitted for interpretation post-operatively. FLUOROSCOPY TIME:  9 seconds. C-arm fluoroscopic images were obtained intraoperatively and submitted for post operative interpretation. COMPARISON:  Left hip x-rays from yesterday. FINDINGS: Intraoperative x-rays demonstrate interval left hip hemiarthroplasty. Alignment is normal. No acute abnormality. IMPRESSION: Interval left hip hemiarthroplasty. Electronically Signed   By: Obie Dredge M.D.   On: 07/03/2017 15:39   Dg Hip Operative Unilat With Pelvis Left  Result Date: 07/03/2017 CLINICAL DATA:  Left hip replacement. EXAM: OPERATIVE LEFT HIP (WITH PELVIS IF PERFORMED) TECHNIQUE: Fluoroscopic spot image(s) were submitted for interpretation post-operatively. FLUOROSCOPY TIME:  9 seconds. C-arm fluoroscopic images were obtained intraoperatively and submitted for post operative interpretation. COMPARISON:  Left hip x-rays from yesterday. FINDINGS: Intraoperative x-rays demonstrate interval left hip  hemiarthroplasty. Alignment is normal. No acute abnormality. IMPRESSION: Interval left hip hemiarthroplasty. Electronically Signed   By: Obie Dredge M.D.   On: 07/03/2017 15:39   Dg Hip Unilat W Or Wo Pelvis 2-3 Views Left  Result Date: 07/02/2017 CLINICAL DATA:  Unwitnessed fall EXAM: DG HIP (WITH OR WITHOUT PELVIS) 2-3V LEFT COMPARISON:  None. FINDINGS: Mild SI joint degenerative changes. Pubic symphysis and rami are intact. Both femoral heads project in joint. Acute left femoral neck fracture with cephalad migration of the trochanter. Left femoral head projects in joint. Multiple phleboliths in the pelvis.  IMPRESSION: Acute displaced left femoral neck fracture Electronically Signed   By: Jasmine Pang M.D.   On: 07/02/2017 23:23    Assessment and Plan-  Post -op wound infection-start doxycycline 100 mg BID for 14 days; wound care nurse to follow daily     Thurston Hole D. Lyn Hollingshead, M.D.

## 2017-08-04 ENCOUNTER — Ambulatory Visit: Payer: Self-pay | Admitting: Orthopedic Surgery

## 2017-08-05 ENCOUNTER — Encounter (HOSPITAL_COMMUNITY): Payer: Self-pay | Admitting: *Deleted

## 2017-08-05 ENCOUNTER — Encounter: Payer: Self-pay | Admitting: Internal Medicine

## 2017-08-05 ENCOUNTER — Other Ambulatory Visit: Payer: Self-pay

## 2017-08-05 ENCOUNTER — Non-Acute Institutional Stay (SKILLED_NURSING_FACILITY): Payer: Medicare Other | Admitting: Internal Medicine

## 2017-08-05 DIAGNOSIS — F0280 Dementia in other diseases classified elsewhere without behavioral disturbance: Secondary | ICD-10-CM

## 2017-08-05 DIAGNOSIS — G301 Alzheimer's disease with late onset: Secondary | ICD-10-CM | POA: Diagnosis not present

## 2017-08-05 DIAGNOSIS — R42 Dizziness and giddiness: Secondary | ICD-10-CM

## 2017-08-05 DIAGNOSIS — F32A Depression, unspecified: Secondary | ICD-10-CM

## 2017-08-05 DIAGNOSIS — F329 Major depressive disorder, single episode, unspecified: Secondary | ICD-10-CM | POA: Diagnosis not present

## 2017-08-05 NOTE — Progress Notes (Addendum)
Spoke with dr Chaney Mallinghodierne mda and made aware patient medical history and 07-02-17 ekg results, and cbc 07-07-17 results and bmet 07-06-17 results. Orders received from dr hodierne  mda to repeat cbc day of surgery and obtain type and screen, ok to use 07-02-17 ekg results. Patient is to be treated like a total joint surgery for pre op per dr Chaney Mallinghodierne mda

## 2017-08-05 NOTE — Progress Notes (Signed)
[facility] Room Number: 108-P Place of Service:  SNF (31)  Margit HanksAnne D Alexander, MD  Patient Care Team: System, Pcp Not In as PCP - General  Extended Emergency Contact Information Primary Emergency Contact: Velez,TIM Address: [address]          [place], [place] [address] [place] Home Phone: [phone] Relation: Son    Allergies: Patient has no known allergies.  Chief Complaint  Patient presents with  . Medical Management of Chronic Issues    Routine Visit    HPI: Patient is 84 y.o. female who is being seen for routine issues of dementia, depression, and vertigo.  Past Medical History:  Diagnosis Date  . Alzheimers disease   . Amnesia   . Depression   . H/O fracture of left hip 07/03/2017  . Hyperlipidemia   . Overactive bladder   . Sleep apnea    no cpap used  . Vertigo     Past Surgical History:  Procedure Laterality Date  . ANKLE SURGERY  yrs ago  . ANTERIOR APPROACH HEMI HIP ARTHROPLASTY Left 07/03/2017   Procedure: ANTERIOR APPROACH HEMI HIP ARTHROPLASTY;  Surgeon: Samson FredericSwinteck, Brian, MD;  Location: [facility];  Service: Orthopedics;  Laterality: Left;  . BACK SURGERY     yrs ago  . INCISION AND DRAINAGE HIP Left 08/06/2017   Procedure: IRRIGATION AND DEBRIDEMENT LEFT HIP;  Surgeon: Samson FredericSwinteck, Brian, MD;  Location: [facility];  Service: Orthopedics;  Laterality: Left;  . KNEE SURGERY     replacement not sure which sure    Allergies as of 08/05/2017   No Known Allergies     Medication List        Accurate as of 08/05/17 11:59 PM. Always use your most recent med list.          acetaminophen 325 MG tablet Commonly known as:  TYLENOL Take 650 mg by mouth every 8 (eight) hours as needed for mild pain.   amoxicillin 500 MG capsule Commonly known as:  AMOXIL Take 1 capsule (500 mg total) by mouth 3 (three) times daily for 14 days.   bisacodyl 10 MG suppository Commonly known as:  DULCOLAX Place 10  mg rectally as needed for moderate constipation (If not relived by MOM).   buPROPion 75 MG tablet Commonly known as:  WELLBUTRIN Take 75 mg by mouth 2 (two) times daily.   docusate sodium 100 MG capsule Commonly known as:  COLACE Take 1 capsule (100 mg total) by mouth 2 (two) times daily.   donepezil 10 MG tablet Commonly known as:  ARICEPT Take 10 mg by mouth at bedtime.   escitalopram 10 MG tablet Commonly known as:  LEXAPRO Take 15 mg by mouth daily.   magnesium hydroxide 400 MG/5ML suspension Commonly known as:  MILK OF MAGNESIA Take 30 mLs by mouth daily as needed for mild constipation.   meclizine 12.5 MG tablet Commonly known as:  ANTIVERT Take 12.5 mg by mouth 3 (three) times daily as needed for dizziness.   memantine 10 MG tablet Commonly known as:  NAMENDA Take 10 mg by mouth 2 (two) times daily.   polyvinyl alcohol 1.4 % ophthalmic solution Commonly known as:  LIQUIFILM TEARS Place 1 drop into both eyes 3 (three) times daily as needed for dry eyes.   PRESERVISION AREDS PO Take 1 capsule by mouth daily.   RA SALINE ENEMA 19-7 GM/118ML Enem Place 1 each rectally daily as  needed (if not relieved by Bisacodyl).   simvastatin 40 MG tablet Commonly known as:  ZOCOR Take 40 mg by mouth daily.   traZODone 50 MG tablet Commonly known as:  DESYREL Take 50 mg by mouth at bedtime.   triamcinolone cream 0.1 % Commonly known as:  KENALOG Apply 1 application topically daily as needed (for itching).   vitamin B-12 500 MCG tablet Commonly known as:  CYANOCOBALAMIN Take 500 mcg by mouth daily.       No orders of the defined types were placed in this encounter.   Immunization History  Administered Date(s) Administered  . Influenza, High Dose Seasonal PF 11/13/2014, 12/13/2015, 12/07/2016  . PPD Test 11/20/2015  . Pneumococcal Conjugate-13 09/12/2013  . Pneumococcal Polysaccharide-23 01/15/1998  . Td 08/24/2007  . Zoster 01/26/2008    Social History    Tobacco Use  . Smoking status: Never Smoker  . Smokeless tobacco: Never Used  Substance Use Topics  . Alcohol use: No    Review of Systems  DATA OBTAINED: from patient, nurse GENERAL:  no fevers, fatigue, appetite changes SKIN: No itching, rash HEENT: No complaint RESPIRATORY: No cough, wheezing, SOB CARDIAC: No chest pain, palpitations, lower extremity edema  GI: No abdominal pain, No N/V/D or constipation, No heartburn or reflux  GU: No dysuria, frequency or urgency, or incontinence  MUSCULOSKELETAL: No unrelieved bone/joint pain NEUROLOGIC: No headache, dizziness  PSYCHIATRIC: No overt anxiety or sadness  Vitals:   08/05/17 1100  BP: 135/65  Pulse: 78  Resp: 18  Temp: 98.5 F (36.9 C)   Body mass index is 25.66 kg/m. Physical Exam  GENERAL APPEARANCE: Alert, conversant, No acute distress  SKIN: No diaphoresis rash HEENT: Unremarkable RESPIRATORY: Breathing is even, unlabored. Lung sounds are clear   CARDIOVASCULAR: Heart RRR no murmurs, rubs or gallops. No peripheral edema  GASTROINTESTINAL: Abdomen is soft, non-tender, not distended w/ normal bowel sounds.  GENITOURINARY: Bladder non tender, not distended  MUSCULOSKELETAL: No abnormal joints or musculature NEUROLOGIC: Cranial nerves 2-12 grossly intact. Moves all extremities PSYCHIATRIC: Mood and affect dementia, no behavioral issues  Patient Active Problem List   Diagnosis Date Noted  . Insomnia 08/16/2017  . Postoperative wound dehiscence 08/06/2017  . H/O total hip arthroplasty, left 07/11/2017  . Acute blood loss as cause of postoperative anemia 07/11/2017  . Hyperlipidemia 07/11/2017  . Depression 07/11/2017  . Vertigo 07/11/2017  . Closed left hip fracture, initial encounter (HCC) 07/03/2017  . Alzheimer's dementia without behavioral disturbance 07/03/2017  . Displaced fracture of left femoral neck (HCC) 07/03/2017    CMP     Component Value Date/Time   NA 142 08/09/2017 0455   K 3.7  08/09/2017 0455   CL 104 08/09/2017 0455   CO2 31 08/09/2017 0455   GLUCOSE 85 08/09/2017 0455   BUN 15 08/09/2017 0455   CREATININE 0.83 08/09/2017 0455   CALCIUM 8.3 (L) 08/09/2017 0455   PROT 6.8 07/02/2017 2325   ALBUMIN 3.5 07/02/2017 2325   AST 16 07/02/2017 2325   ALT 11 (L) 07/02/2017 2325   ALKPHOS 136 (H) 07/02/2017 2325   BILITOT 1.0 07/02/2017 2325   GFRNONAA 59 (L) 08/09/2017 0455   GFRAA >60 08/09/2017 0455   Recent Labs    08/07/17 0511 08/08/17 0543 08/09/17 0455  NA 141 145 142  K 4.2 4.4 3.7  CL 106 109 104  CO2 28 30 31   GLUCOSE 90 87 85  BUN 15 16 15   CREATININE 0.83 0.79 0.83  CALCIUM 8.2* 8.1*  8.3*   Recent Labs    07/02/17 2325  AST 16  ALT 11*  ALKPHOS 136*  BILITOT 1.0  PROT 6.8  ALBUMIN 3.5   Recent Labs    07/05/17 0513 07/06/17 0447 07/07/17 0450  08/07/17 0511 08/08/17 0543 08/09/17 0455  WBC 9.8 10.0 8.9   < > 6.9 6.6 6.7  NEUTROABS 6.9 7.2 6.5  --   --   --   --   HGB 10.2* 10.2* 9.9*   < > 10.0* 9.6* 10.8*  HCT 31.7* 31.9* 31.2*   < > 32.3* 31.8* 34.7*  MCV 94.6 94.9 94.0   < > 97.9 99.7 98.0  PLT 219 211 201   < > 194 175 208   < > = values in this interval not displayed.   No results for input(s): CHOL, LDLCALC, TRIG in the last 8760 hours.  Invalid input(s): HCL No results found for: MICROALBUR No results found for: TSH No results found for: HGBA1C No results found for: CHOL, HDL, LDLCALC, LDLDIRECT, TRIG, CHOLHDL  Significant Diagnostic Results in last 30 days:  Dg C-arm 1-60 Min-no Report  Result Date: 08/06/2017 Fluoroscopy was utilized by the requesting physician.  No radiographic interpretation.    Assessment and Plan  Alzheimer's dementia without behavioral disturbance Stable; tinea Aricept 10 mg daily and Namenda 10 mg twice daily  Depression Stable; continue Wellbutrin 75 mg twice daily, Lexapro 15 mg daily and trazodone 50 mg nightly  Vertigo No known recent episodes; continue Antivert 12.5 mg 3  times daily as needed should episodes occur    Margit Hanks, MD

## 2017-08-05 NOTE — Progress Notes (Signed)
Preop instructions NUU:VOZDfor:Dawn Gordon   Date of Birth  Nov 19, 1920                          Date of Procedure:  08-06-2017     Doctor: Linna CapriceSWINTECK [facility] AM Report to: Admitting  Procedure: IRRIGATION AND DEBRIDEMENT LEFT HIP, POSSIBLE BALL HEAD EXCHANGE LEFT    Do not eat or drink past midnight the night before your procedure.(To include any tube feedings-must be discontinued)   Take these morning medications only with sips of water ESCITALOPRAM (LEXAPRO), NAMENDA IF TAKEN IN AM, EYE DROP IF NEEDED, SIMVASTATIN (ZOCOR) IF TAKEN IN AM    Facility contact: MONET RN   Phone: [phone] FAX [phone]  Health Care POA: SON TIM Raybuck PHONE [phone] ( I HAVE SPOKEN WITH SON )  Transportation contact phone#: [facility] TO BRING PATIENT PER POA SON TIM  Please send day of procedure:current med list and meds last taken that day, confirm nothing by mouth status from what time, Patient Demographic info( to include DNR status, problem list, allergies)     Bring Insurance card and picture ID Leave all jewelry and other valuables at place where living( no metal or rings to be worn) No contact lens Women-no make-up, no lotions,perfumes,powders Men-no colognes,lotions  Any questions day of procedure,call SHORT STAY UNIT [phone]   [facility] :[facility]                   Phone:[phone]                   [phone]  Sent by : Cain SieveSHARON Jasimine Simms RN

## 2017-08-05 NOTE — Progress Notes (Signed)
Spoke by phone with brent at [facility] confirmed all faxed instructions received and understood.

## 2017-08-05 NOTE — Progress Notes (Deleted)
Location:    Nursing Home Room Number: 108-P Place of Service:     Margit HanksAnne D Alexander, MD  Patient Care Team: System, Pcp Not In as PCP - General  Extended Emergency Contact Information Primary Emergency Contact: Monds,TIM Address: [address]          [place], [place] [id number] [place] Home Phone: [phone] Relation: Son    Allergies: Patient has no known allergies.  No chief complaint on file.   HPI: Patient is 84 y.o. female who   Past Medical History:  Diagnosis Date  . Alzheimers disease   . Amnesia   . Depression   . H/O fracture of left hip 07/03/2017  . Hyperlipidemia   . Overactive bladder   . Sleep apnea    no cpap used  . Vertigo     Past Surgical History:  Procedure Laterality Date  . ANKLE SURGERY  yrs ago  . ANTERIOR APPROACH HEMI HIP ARTHROPLASTY Left 07/03/2017   Procedure: ANTERIOR APPROACH HEMI HIP ARTHROPLASTY;  Surgeon: Samson FredericSwinteck, Brian, MD;  Location: [facility];  Service: Orthopedics;  Laterality: Left;  . BACK SURGERY     yrs ago  . KNEE SURGERY     replacement not sure which sure    Allergies as of 08/05/2017   No Known Allergies     Medication List    Notice   Cannot display discharge medications because the patient has not yet been admitted.     No orders of the defined types were placed in this encounter.   Immunization History  Administered Date(s) Administered  . Influenza, High Dose Seasonal PF 11/13/2014, 12/13/2015, 12/07/2016  . PPD Test 11/20/2015  . Pneumococcal Conjugate-13 09/12/2013  . Pneumococcal Polysaccharide-23 01/15/1998  . Td 08/24/2007  . Zoster 01/26/2008    Social History   Tobacco Use  . Smoking status: Never Smoker  . Smokeless tobacco: Never Used  Substance Use Topics  . Alcohol use: No    Review of Systems  DATA OBTAINED: from patient, nurse, medical record, family member GENERAL:  no fevers, fatigue, appetite changes SKIN: No itching, rash HEENT: No  complaint RESPIRATORY: No cough, wheezing, SOB CARDIAC: No chest pain, palpitations, lower extremity edema  GI: No abdominal pain, No N/V/D or constipation, No heartburn or reflux  GU: No dysuria, frequency or urgency, or incontinence  MUSCULOSKELETAL: No unrelieved bone/joint pain NEUROLOGIC: No headache, dizziness  PSYCHIATRIC: No overt anxiety or sadness  There were no vitals filed for this visit. There is no height or weight on file to calculate BMI. Physical Exam  GENERAL APPEARANCE: Alert, conversant, No acute distress  SKIN: No diaphoresis rash HEENT: Unremarkable RESPIRATORY: Breathing is even, unlabored. Lung sounds are clear   CARDIOVASCULAR: Heart RRR no murmurs, rubs or gallops. No peripheral edema  GASTROINTESTINAL: Abdomen is soft, non-tender, not distended w/ normal bowel sounds.  GENITOURINARY: Bladder non tender, not distended  MUSCULOSKELETAL: No abnormal joints or musculature NEUROLOGIC: Cranial nerves 2-12 grossly intact. Moves all extremities PSYCHIATRIC: Mood and affect appropriate to situation, no behavioral issues  Patient Active Problem List   Diagnosis Date Noted  . H/O total hip arthroplasty, left 07/11/2017  . Acute blood loss as cause of postoperative anemia 07/11/2017  . Hyperlipidemia 07/11/2017  . Depression 07/11/2017  . Vertigo 07/11/2017  . Closed left hip fracture, initial encounter (HCC) 07/03/2017  . Alzheimer's dementia without behavioral disturbance 07/03/2017  . Displaced fracture of left femoral neck (HCC) 07/03/2017    CMP  Component Value Date/Time   NA 138 07/06/2017 0447   K 4.3 07/06/2017 0447   CL 104 07/06/2017 0447   CO2 25 07/06/2017 0447   GLUCOSE 100 (H) 07/06/2017 0447   BUN 18 07/06/2017 0447   CREATININE 0.62 07/06/2017 0447   CALCIUM 8.0 (L) 07/06/2017 0447   PROT 6.8 07/02/2017 2325   ALBUMIN 3.5 07/02/2017 2325   AST 16 07/02/2017 2325   ALT 11 (L) 07/02/2017 2325   ALKPHOS 136 (H) 07/02/2017 2325    BILITOT 1.0 07/02/2017 2325   GFRNONAA >60 07/06/2017 0447   GFRAA >60 07/06/2017 0447   Recent Labs    07/04/17 0524 07/05/17 0513 07/06/17 0447  NA 137 139 138  K 4.3 3.3* 4.3  CL 103 102 104  CO2 20* 28 25  GLUCOSE 111* 106* 100*  BUN 24* 33* 18  CREATININE 0.90 0.82 0.62  CALCIUM 8.5* 8.3* 8.0*   Recent Labs    07/02/17 2325  AST 16  ALT 11*  ALKPHOS 136*  BILITOT 1.0  PROT 6.8  ALBUMIN 3.5   Recent Labs    07/05/17 0513 07/06/17 0447 07/07/17 0450  WBC 9.8 10.0 8.9  NEUTROABS 6.9 7.2 6.5  HGB 10.2* 10.2* 9.9*  HCT 31.7* 31.9* 31.2*  MCV 94.6 94.9 94.0  PLT 219 211 201   No results for input(s): CHOL, LDLCALC, TRIG in the last 8760 hours.  Invalid input(s): HCL No results found for: MICROALBUR No results found for: TSH No results found for: HGBA1C No results found for: CHOL, HDL, LDLCALC, LDLDIRECT, TRIG, CHOLHDL  Significant Diagnostic Results in last 30 days:  No results found.  Assessment and Plan  No problem-specific Assessment & Plan notes found for this encounter.   Labs/tests ordered:    Margit Hanks, MD

## 2017-08-05 NOTE — Progress Notes (Signed)
Spoke with son tim Vanderslice poa and he will arrive 915 am day of surgery  6-6-19at [facility] and bring copy of health care poa to scan in. Facility to bring patient per poa son tim Misner. Reviewed patient medical history with epic records and son poa tim Sharrow .

## 2017-08-05 NOTE — Progress Notes (Signed)
Requested medication record, face sheet with poa information, labs with in with last month, last history and physical note monet rn stated patient transfers with 1 person assist from wheel chair and has poa son due to dementia and her fax number is [phone] and her phone number is [phone] and she will fax records asap today.

## 2017-08-06 ENCOUNTER — Inpatient Hospital Stay (HOSPITAL_COMMUNITY): Payer: Medicare Other

## 2017-08-06 ENCOUNTER — Encounter (HOSPITAL_COMMUNITY)
Admission: RE | Disposition: A | Discharge status: Skilled Nursing Facility | Payer: Self-pay | Source: Home / Self Care | Attending: Orthopedic Surgery

## 2017-08-06 ENCOUNTER — Encounter (HOSPITAL_COMMUNITY): Payer: Self-pay | Admitting: *Deleted

## 2017-08-06 ENCOUNTER — Other Ambulatory Visit: Payer: Self-pay

## 2017-08-06 ENCOUNTER — Inpatient Hospital Stay (HOSPITAL_COMMUNITY): Payer: Medicare Other | Admitting: Anesthesiology

## 2017-08-06 ENCOUNTER — Inpatient Hospital Stay (HOSPITAL_COMMUNITY)
Admission: RE | Admit: 2017-08-06 | Discharge: 2017-08-10 | DRG: 857 | Disposition: A | Discharge status: Skilled Nursing Facility | Payer: Medicare Other | Attending: Orthopedic Surgery | Admitting: Orthopedic Surgery

## 2017-08-06 DIAGNOSIS — Z96659 Presence of unspecified artificial knee joint: Secondary | ICD-10-CM | POA: Diagnosis present

## 2017-08-06 DIAGNOSIS — Z96642 Presence of left artificial hip joint: Secondary | ICD-10-CM | POA: Diagnosis present

## 2017-08-06 DIAGNOSIS — T8141XA Infection following a procedure, superficial incisional surgical site, initial encounter: Principal | ICD-10-CM | POA: Diagnosis present

## 2017-08-06 DIAGNOSIS — Y838 Other surgical procedures as the cause of abnormal reaction of the patient, or of later complication, without mention of misadventure at the time of the procedure: Secondary | ICD-10-CM | POA: Diagnosis present

## 2017-08-06 DIAGNOSIS — G301 Alzheimer's disease with late onset: Secondary | ICD-10-CM | POA: Diagnosis not present

## 2017-08-06 DIAGNOSIS — E785 Hyperlipidemia, unspecified: Secondary | ICD-10-CM | POA: Diagnosis present

## 2017-08-06 DIAGNOSIS — F5101 Primary insomnia: Secondary | ICD-10-CM | POA: Diagnosis not present

## 2017-08-06 DIAGNOSIS — T8131XA Disruption of external operation (surgical) wound, not elsewhere classified, initial encounter: Secondary | ICD-10-CM | POA: Diagnosis present

## 2017-08-06 DIAGNOSIS — F0280 Dementia in other diseases classified elsewhere without behavioral disturbance: Secondary | ICD-10-CM | POA: Diagnosis present

## 2017-08-06 DIAGNOSIS — G309 Alzheimer's disease, unspecified: Secondary | ICD-10-CM | POA: Diagnosis present

## 2017-08-06 DIAGNOSIS — F329 Major depressive disorder, single episode, unspecified: Secondary | ICD-10-CM | POA: Diagnosis not present

## 2017-08-06 DIAGNOSIS — T8131XD Disruption of external operation (surgical) wound, not elsewhere classified, subsequent encounter: Secondary | ICD-10-CM | POA: Diagnosis not present

## 2017-08-06 HISTORY — PX: INCISION AND DRAINAGE HIP: SHX1801

## 2017-08-06 HISTORY — DX: Dizziness and giddiness: R42

## 2017-08-06 HISTORY — DX: Dementia in other diseases classified elsewhere, unspecified severity, without behavioral disturbance, psychotic disturbance, mood disturbance, and anxiety: F02.80

## 2017-08-06 HISTORY — DX: Depression, unspecified: F32.A

## 2017-08-06 HISTORY — DX: Alzheimer's disease, unspecified: G30.9

## 2017-08-06 HISTORY — DX: Major depressive disorder, single episode, unspecified: F32.9

## 2017-08-06 LAB — GLUCOSE, CAPILLARY: Glucose-Capillary: 188 mg/dL — ABNORMAL HIGH (ref 65–99)

## 2017-08-06 LAB — CBC
HEMATOCRIT: 37.8 % (ref 36.0–46.0)
HEMATOCRIT: 37.4 % (ref 36.0–46.0)
Hemoglobin: 11.7 g/dL — ABNORMAL LOW (ref 12.0–15.0)
Hemoglobin: 11.6 g/dL — ABNORMAL LOW (ref 12.0–15.0)
MCH: 30.1 pg (ref 26.0–34.0)
MCH: 30.4 pg (ref 26.0–34.0)
MCHC: 31 g/dL (ref 30.0–36.0)
MCHC: 31 g/dL (ref 30.0–36.0)
MCV: 97.2 fL (ref 78.0–100.0)
MCV: 98.2 fL (ref 78.0–100.0)
PLATELETS: 202 10*3/uL (ref 150–400)
PLATELETS: 157 10*3/uL (ref 150–400)
RBC: 3.89 MIL/uL (ref 3.87–5.11)
RBC: 3.81 MIL/uL — ABNORMAL LOW (ref 3.87–5.11)
RDW: 15.6 % — AB (ref 11.5–15.5)
RDW: 15.5 % (ref 11.5–15.5)
WBC: 7.2 10*3/uL (ref 4.0–10.5)
WBC: 10.6 10*3/uL — ABNORMAL HIGH (ref 4.0–10.5)

## 2017-08-06 LAB — CREATININE, SERUM
Creatinine, Ser: 0.82 mg/dL (ref 0.44–1.00)
GFR calc Af Amer: >60 mL/min (ref >60)
GFR calc non Af Amer: 59 mL/min — ABNORMAL LOW (ref >60)

## 2017-08-06 LAB — TYPE AND SCREEN
ABO/RH(D): A POS
ANTIBODY SCREEN: NEGATIVE

## 2017-08-06 SURGERY — IRRIGATION AND DEBRIDEMENT HIP
Anesthesia: General | Site: Hip | Laterality: Left

## 2017-08-06 MED ORDER — CEFAZOLIN SODIUM-DEXTROSE 2-4 GM/100ML-% IV SOLN
INTRAVENOUS | Status: AC
  Filled 2017-08-06: qty 100

## 2017-08-06 MED ORDER — LACTATED RINGERS IV SOLN
INTRAVENOUS | Status: DC
  Administered 2017-08-06 (×2): via INTRAVENOUS

## 2017-08-06 MED ORDER — FLEET ENEMA 7-19 GM/118ML RE ENEM: 1.0000 | ENEMA | Freq: Every day | RECTAL | Status: DC | PRN

## 2017-08-06 MED ORDER — PROPOFOL 10 MG/ML IV BOLUS
INTRAVENOUS | Status: DC | PRN
  Administered 2017-08-06: 70 mg via INTRAVENOUS
  Administered 2017-08-06: 30 mg via INTRAVENOUS
  Administered 2017-08-06: 20 mg via INTRAVENOUS

## 2017-08-06 MED ORDER — ONDANSETRON HCL 4 MG/2ML IJ SOLN
INTRAMUSCULAR | Status: DC | PRN
  Administered 2017-08-06: 4 mg via INTRAVENOUS

## 2017-08-06 MED ORDER — SODIUM CHLORIDE 0.9 % IV SOLN
INTRAVENOUS | Status: AC
  Filled 2017-08-06: qty 500000

## 2017-08-06 MED ORDER — SIMVASTATIN 20 MG PO TABS
40.0000 mg | ORAL_TABLET | Freq: Every day | ORAL | Status: DC
  Administered 2017-08-06 – 2017-08-10 (×5): 40 mg via ORAL
  Filled 2017-08-06 (×5): qty 2

## 2017-08-06 MED ORDER — FENTANYL CITRATE (PF) 100 MCG/2ML IJ SOLN
INTRAMUSCULAR | Status: DC | PRN
  Administered 2017-08-06 (×2): 25 ug via INTRAVENOUS

## 2017-08-06 MED ORDER — DONEPEZIL HCL 10 MG PO TABS
10.0000 mg | ORAL_TABLET | Freq: Every day | ORAL | Status: DC
  Administered 2017-08-06 – 2017-08-09 (×4): 10 mg via ORAL
  Filled 2017-08-06 (×4): qty 1

## 2017-08-06 MED ORDER — DEXAMETHASONE SODIUM PHOSPHATE 10 MG/ML IJ SOLN
INTRAMUSCULAR | Status: DC | PRN
  Administered 2017-08-06: 6 mg via INTRAVENOUS

## 2017-08-06 MED ORDER — BUPROPION HCL 75 MG PO TABS
75.0000 mg | ORAL_TABLET | Freq: Two times a day (BID) | ORAL | Status: DC
  Administered 2017-08-06 – 2017-08-10 (×8): 75 mg via ORAL
  Filled 2017-08-06 (×9): qty 1

## 2017-08-06 MED ORDER — VANCOMYCIN HCL IN DEXTROSE 750-5 MG/150ML-% IV SOLN
750.0000 mg | INTRAVENOUS | Status: DC
Start: 2017-08-08 — End: 2017-08-10
  Administered 2017-08-08 – 2017-08-09 (×2): 750 mg via INTRAVENOUS
  Filled 2017-08-06 (×3): qty 150

## 2017-08-06 MED ORDER — MECLIZINE HCL 25 MG PO TABS: 12.5000 mg | ORAL_TABLET | Freq: Three times a day (TID) | ORAL | Status: DC | PRN

## 2017-08-06 MED ORDER — OXYCODONE HCL 5 MG PO TABS: 5.0000 mg | ORAL_TABLET | Freq: Once | ORAL | Status: DC | PRN

## 2017-08-06 MED ORDER — METOCLOPRAMIDE HCL 5 MG/ML IJ SOLN: 5.0000 mg | Freq: Three times a day (TID) | INTRAMUSCULAR | Status: DC | PRN

## 2017-08-06 MED ORDER — ONDANSETRON HCL 4 MG/2ML IJ SOLN
INTRAMUSCULAR | Status: AC
  Filled 2017-08-06: qty 2

## 2017-08-06 MED ORDER — ENOXAPARIN SODIUM 40 MG/0.4ML ~~LOC~~ SOLN
40.0000 mg | SUBCUTANEOUS | Status: DC
  Administered 2017-08-07 – 2017-08-10 (×4): 40 mg via SUBCUTANEOUS
  Filled 2017-08-06 (×4): qty 0.4

## 2017-08-06 MED ORDER — OXYCODONE HCL 5 MG/5ML PO SOLN: 5.0000 mg | Freq: Once | ORAL | Status: DC | PRN

## 2017-08-06 MED ORDER — FENTANYL CITRATE (PF) 100 MCG/2ML IJ SOLN
INTRAMUSCULAR | Status: AC
  Filled 2017-08-06: qty 2

## 2017-08-06 MED ORDER — POLYVINYL ALCOHOL 1.4 % OP SOLN
1.0000 [drp] | Freq: Three times a day (TID) | OPHTHALMIC | Status: DC | PRN
  Filled 2017-08-06: qty 15

## 2017-08-06 MED ORDER — ACETAMINOPHEN 10 MG/ML IV SOLN
INTRAVENOUS | Status: AC
  Filled 2017-08-06: qty 100

## 2017-08-06 MED ORDER — SODIUM CHLORIDE 0.9 % IV SOLN
INTRAVENOUS | Status: DC
  Administered 2017-08-06 – 2017-08-08 (×3): via INTRAVENOUS

## 2017-08-06 MED ORDER — ONDANSETRON HCL 4 MG/2ML IJ SOLN: 4.0000 mg | Freq: Once | INTRAMUSCULAR | Status: DC | PRN

## 2017-08-06 MED ORDER — DOCUSATE SODIUM 100 MG PO CAPS
100.0000 mg | ORAL_CAPSULE | Freq: Two times a day (BID) | ORAL | Status: DC
  Administered 2017-08-06 – 2017-08-10 (×8): 100 mg via ORAL
  Filled 2017-08-06 (×8): qty 1

## 2017-08-06 MED ORDER — VANCOMYCIN HCL IN DEXTROSE 750-5 MG/150ML-% IV SOLN
750.0000 mg | Freq: Once | INTRAVENOUS | Status: AC
  Administered 2017-08-06: 750 mg via INTRAVENOUS
  Filled 2017-08-06: qty 150

## 2017-08-06 MED ORDER — MEMANTINE HCL 10 MG PO TABS
10.0000 mg | ORAL_TABLET | Freq: Two times a day (BID) | ORAL | Status: DC
  Administered 2017-08-06 – 2017-08-10 (×8): 10 mg via ORAL
  Filled 2017-08-06 (×8): qty 1

## 2017-08-06 MED ORDER — ESCITALOPRAM OXALATE 10 MG PO TABS
15.0000 mg | ORAL_TABLET | Freq: Every day | ORAL | Status: DC
  Administered 2017-08-07 – 2017-08-10 (×4): 15 mg via ORAL
  Filled 2017-08-06 (×4): qty 2

## 2017-08-06 MED ORDER — BISACODYL 10 MG RE SUPP: 10.0000 mg | RECTAL | Status: DC | PRN

## 2017-08-06 MED ORDER — METOCLOPRAMIDE HCL 5 MG PO TABS: 5.0000 mg | ORAL_TABLET | Freq: Three times a day (TID) | ORAL | Status: DC | PRN

## 2017-08-06 MED ORDER — LIDOCAINE 2% (20 MG/ML) 5 ML SYRINGE
INTRAMUSCULAR | Status: AC
  Filled 2017-08-06: qty 5

## 2017-08-06 MED ORDER — CEFAZOLIN SODIUM-DEXTROSE 2-3 GM-%(50ML) IV SOLR
INTRAVENOUS | Status: DC | PRN
  Administered 2017-08-06: 2 g via INTRAVENOUS

## 2017-08-06 MED ORDER — DEXAMETHASONE SODIUM PHOSPHATE 10 MG/ML IJ SOLN
INTRAMUSCULAR | Status: AC
  Filled 2017-08-06: qty 1

## 2017-08-06 MED ORDER — ACETAMINOPHEN 10 MG/ML IV SOLN
INTRAVENOUS | Status: DC | PRN
  Administered 2017-08-06: 1000 mg via INTRAVENOUS

## 2017-08-06 MED ORDER — TRAZODONE HCL 50 MG PO TABS
50.0000 mg | ORAL_TABLET | Freq: Every day | ORAL | Status: DC
  Administered 2017-08-06 – 2017-08-09 (×4): 50 mg via ORAL
  Filled 2017-08-06 (×4): qty 1

## 2017-08-06 MED ORDER — CHLORHEXIDINE GLUCONATE 4 % EX LIQD: 60.0000 mL | Freq: Once | CUTANEOUS | Status: DC

## 2017-08-06 MED ORDER — ONDANSETRON HCL 4 MG PO TABS: 4.0000 mg | ORAL_TABLET | Freq: Four times a day (QID) | ORAL | Status: DC | PRN

## 2017-08-06 MED ORDER — LIDOCAINE HCL (CARDIAC) PF 50 MG/5ML IV SOSY
PREFILLED_SYRINGE | INTRAVENOUS | Status: DC | PRN
  Administered 2017-08-06: 50 mg via INTRAVENOUS

## 2017-08-06 MED ORDER — PRESERVISION AREDS PO CAPS: ORAL_CAPSULE | Freq: Every day | ORAL | Status: DC

## 2017-08-06 MED ORDER — EPHEDRINE SULFATE 50 MG/ML IJ SOLN
INTRAMUSCULAR | Status: DC | PRN
  Administered 2017-08-06: 5 mg via INTRAVENOUS
  Administered 2017-08-06: 10 mg via INTRAVENOUS

## 2017-08-06 MED ORDER — RA SALINE ENEMA 19-7 GM/118ML RE ENEM: 1.0000 | ENEMA | Freq: Every day | RECTAL | Status: DC | PRN

## 2017-08-06 MED ORDER — ACETAMINOPHEN 325 MG PO TABS
650.0000 mg | ORAL_TABLET | Freq: Four times a day (QID) | ORAL | Status: DC | PRN
  Administered 2017-08-07 – 2017-08-09 (×5): 650 mg via ORAL
  Filled 2017-08-06 (×5): qty 2

## 2017-08-06 MED ORDER — DEXTROSE 5 % IV SOLN
500.0000 mg | Freq: Two times a day (BID) | INTRAVENOUS | Status: DC
  Administered 2017-08-07: 500 mg via INTRAVENOUS
  Filled 2017-08-06 (×2): qty 5

## 2017-08-06 MED ORDER — ONDANSETRON HCL 4 MG/2ML IJ SOLN: 4.0000 mg | Freq: Four times a day (QID) | INTRAMUSCULAR | Status: DC | PRN

## 2017-08-06 MED ORDER — PROSIGHT PO TABS
1.0000 | ORAL_TABLET | Freq: Every day | ORAL | Status: DC
  Administered 2017-08-07 – 2017-08-10 (×4): 1 via ORAL
  Filled 2017-08-06 (×4): qty 1

## 2017-08-06 MED ORDER — FENTANYL CITRATE (PF) 100 MCG/2ML IJ SOLN: 25.0000 ug | INTRAMUSCULAR | Status: DC | PRN

## 2017-08-06 MED ORDER — MAGNESIUM HYDROXIDE 400 MG/5ML PO SUSP
30.0000 mL | Freq: Every day | ORAL | Status: DC | PRN
Start: 2017-08-06 — End: 2017-08-10

## 2017-08-06 MED ORDER — PROPOFOL 10 MG/ML IV BOLUS
INTRAVENOUS | Status: AC
Start: 2017-08-06 — End: ?
  Filled 2017-08-06: qty 40

## 2017-08-06 SURGICAL SUPPLY — 49 items
ADH SKN CLS APL DERMABOND .7 (GAUZE/BANDAGES/DRESSINGS)
BAG SPEC THK2 15X12 ZIP CLS (MISCELLANEOUS) ×1
BAG ZIPLOCK 12X15 (MISCELLANEOUS) ×3 IMPLANT
CONT SPEC 4OZ CLIKSEAL STRL BL (MISCELLANEOUS) ×2 IMPLANT
COVER SURGICAL LIGHT HANDLE (MISCELLANEOUS) ×3 IMPLANT
DERMABOND ADVANCED (GAUZE/BANDAGES/DRESSINGS)
DERMABOND ADVANCED .7 DNX12 (GAUZE/BANDAGES/DRESSINGS) ×1 IMPLANT
DRAPE ORTHO SPLIT 77X108 STRL (DRAPES) ×6
DRAPE SURG 17X11 SM STRL (DRAPES) ×3 IMPLANT
DRAPE SURG ORHT 6 SPLT 77X108 (DRAPES) ×2 IMPLANT
DRAPE U-SHAPE 47X51 STRL (DRAPES) ×3 IMPLANT
DRSG ADAPTIC 3X8 NADH LF (GAUZE/BANDAGES/DRESSINGS) ×2 IMPLANT
DRSG AQUACEL AG ADV 3.5X10 (GAUZE/BANDAGES/DRESSINGS) ×1 IMPLANT
DRSG PAD ABDOMINAL 8X10 ST (GAUZE/BANDAGES/DRESSINGS) ×6 IMPLANT
DRSG VAC ATS MED SENSATRAC (GAUZE/BANDAGES/DRESSINGS) ×2 IMPLANT
DURAPREP 26ML APPLICATOR (WOUND CARE) ×1 IMPLANT
ELECT REM PT RETURN 15FT ADLT (MISCELLANEOUS) ×3 IMPLANT
EVACUATOR 1/8 PVC DRAIN (DRAIN) ×3 IMPLANT
GAUZE SPONGE 2X2 8PLY STRL LF (GAUZE/BANDAGES/DRESSINGS) ×1 IMPLANT
GLOVE BIOGEL PI IND STRL 7.5 (GLOVE) ×1 IMPLANT
GLOVE BIOGEL PI IND STRL 8.5 (GLOVE) ×1 IMPLANT
GLOVE BIOGEL PI INDICATOR 7.5 (GLOVE)
GLOVE BIOGEL PI INDICATOR 8.5 (GLOVE)
GLOVE ECLIPSE 8.0 STRL XLNG CF (GLOVE) IMPLANT
GLOVE ORTHO TXT STRL SZ7.5 (GLOVE) ×2 IMPLANT
GLOVE SURG ORTHO 8.0 STRL STRW (GLOVE) ×1 IMPLANT
GOWN SPEC L3 XXLG W/TWL (GOWN DISPOSABLE) ×2 IMPLANT
GOWN STRL REUS W/TWL LRG LVL3 (GOWN DISPOSABLE) ×1 IMPLANT
HANDPIECE INTERPULSE COAX TIP (DISPOSABLE) ×3
IV NS IRRIG 3000ML ARTHROMATIC (IV SOLUTION) ×3 IMPLANT
KIT BASIN OR (CUSTOM PROCEDURE TRAY) ×3 IMPLANT
MANIFOLD NEPTUNE II (INSTRUMENTS) ×3 IMPLANT
NDL SPNL 18GX3.5 QUINCKE PK (NEEDLE) IMPLANT
NEEDLE SPNL 18GX3.5 QUINCKE PK (NEEDLE) ×3 IMPLANT
PACK TOTAL JOINT (CUSTOM PROCEDURE TRAY) ×3 IMPLANT
POSITIONER SURGICAL ARM (MISCELLANEOUS) ×3 IMPLANT
SET HNDPC FAN SPRY TIP SCT (DISPOSABLE) ×1 IMPLANT
SPONGE GAUZE 2X2 STER 10/PKG (GAUZE/BANDAGES/DRESSINGS)
STAPLER VISISTAT 35W (STAPLE) ×1 IMPLANT
SUT ETHILON 2 0 PSLX (SUTURE) ×8 IMPLANT
SUT MNCRL AB 4-0 PS2 18 (SUTURE) ×1 IMPLANT
SUT MON AB 2-0 CT1 36 (SUTURE) ×4 IMPLANT
SUT VIC AB 1 CT1 36 (SUTURE) ×2 IMPLANT
SUT VIC AB 2-0 CT1 27 (SUTURE)
SUT VIC AB 2-0 CT1 TAPERPNT 27 (SUTURE) ×2 IMPLANT
SWAB COLLECTION DEVICE MRSA (MISCELLANEOUS) ×3 IMPLANT
SWAB CULTURE ESWAB REG 1ML (MISCELLANEOUS) ×3 IMPLANT
SYR 20CC LL (SYRINGE) ×2 IMPLANT
TOWEL OR 17X26 10 PK STRL BLUE (TOWEL DISPOSABLE) ×4 IMPLANT

## 2017-08-06 NOTE — Anesthesia Preprocedure Evaluation (Addendum)
Anesthesia Evaluation  Patient identified by MRN, date of birth, ID band Patient awake    Reviewed: Allergy & Precautions, NPO status , Patient's Chart, lab work & pertinent test results  Airway Mallampati: II  TM Distance: >3 FB Neck ROM: Full    Dental  (+) Dental Advisory Given, Partial Upper   Pulmonary sleep apnea ,    Pulmonary exam normal breath sounds clear to auscultation       Cardiovascular Exercise Tolerance: Good negative cardio ROS Normal cardiovascular exam Rhythm:Regular Rate:Normal     Neuro/Psych Depression Dementia Vertigo    GI/Hepatic negative GI ROS, Neg liver ROS,   Endo/Other  negative endocrine ROS  Renal/GU negative Renal ROS Bladder dysfunction      Musculoskeletal negative musculoskeletal ROS (+)   Abdominal   Peds  Hematology negative hematology ROS (+)   Anesthesia Other Findings   Reproductive/Obstetrics                            Lab Results  Component Value Date   CREATININE 0.62 07/06/2017   BUN 18 07/06/2017   NA 138 07/06/2017   K 4.3 07/06/2017   CL 104 07/06/2017   CO2 25 07/06/2017    Lab Results  Component Value Date   WBC 8.9 07/07/2017   HGB 9.9 (L) 07/07/2017   HCT 31.2 (L) 07/07/2017   MCV 94.0 07/07/2017   PLT 201 07/07/2017    Anesthesia Physical  Anesthesia Plan  ASA: III  Anesthesia Plan: General   Post-op Pain Management:    Induction: Intravenous  PONV Risk Score and Plan: 3 and Treatment may vary due to age or medical condition, Ondansetron and Propofol infusion  Airway Management Planned: LMA  Additional Equipment: None  Intra-op Plan:   Post-operative Plan: Extubation in OR  Informed Consent: I have reviewed the patients History and Physical, chart, labs and discussed the procedure including the risks, benefits and alternatives for the proposed anesthesia with the patient or authorized representative who  has indicated his/her understanding and acceptance.   Dental advisory given  Plan Discussed with: CRNA and Anesthesiologist  Anesthesia Plan Comments:         Anesthesia Quick Evaluation

## 2017-08-06 NOTE — H&P (Signed)
ORTHOPAEDIC CONSULTATION  REQUESTING PHYSICIAN: Samson FredericSwinteck, Kimmarie Pascale, MD  PCP:  System, Pcp Not In  Chief Complaint: Wound dehiscence left hip.    HPI: Dawn Gordon is a 84 y.o. female with advanced dementia who underwent left hip hemiarthroplasty on 07/03/2017.  She was apparently picking her incision, and it opened and became infected.  She has been afebrile.  She has not been complaining of any hip pain.  We discussed debridement and closure, possible head ball exchange if there is any deep involvement.  Past Medical History:  Diagnosis Date  . Alzheimers disease   . Amnesia   . Depression   . H/O fracture of left hip 07/03/2017  . Hyperlipidemia   . Overactive bladder   . Sleep apnea    no cpap used  . Vertigo    Past Surgical History:  Procedure Laterality Date  . ANKLE SURGERY  yrs ago  . ANTERIOR APPROACH HEMI HIP ARTHROPLASTY Left 07/03/2017   Procedure: ANTERIOR APPROACH HEMI HIP ARTHROPLASTY;  Surgeon: Samson FredericSwinteck, Noriko Macari, MD;  Location: [facility];  Service: Orthopedics;  Laterality: Left;  . BACK SURGERY     yrs ago  . KNEE SURGERY     replacement not sure which sure   Social History   Socioeconomic History  . Marital status: Widowed    Spouse name: Not on file  . Number of children: Not on file  . Years of education: Not on file  . Highest education level: Not on file  Occupational History  . Not on file  Social Needs  . Financial resource strain: Not on file  . Food insecurity:    Worry: Not on file    Inability: Not on file  . Transportation needs:    Medical: Not on file    Non-medical: Not on file  Tobacco Use  . Smoking status: Never Smoker  . Smokeless tobacco: Never Used  Substance and Sexual Activity  . Alcohol use: No  . Drug use: Never  . Sexual activity: Not on file  Lifestyle  . Physical activity:    Days per week: Not on file    Minutes per session: Not on file  . Stress: Not on file  Relationships  . Social connections:    Talks on  phone: Not on file    Gets together: Not on file    Attends religious service: Not on file    Active member of club or organization: Not on file    Attends meetings of clubs or organizations: Not on file    Relationship status: Not on file  Other Topics Concern  . Not on file  Social History Narrative  . Not on file   Family History  Family history unknown: Yes   No Known Allergies Prior to Admission medications   Medication Sig Start Date End Date Taking? Authorizing Provider  buPROPion (WELLBUTRIN) 75 MG tablet Take 75 mg by mouth 2 (two) times daily. 06/25/16  Yes [provider]  docusate sodium (COLACE) 100 MG capsule Take 1 capsule (100 mg total) by mouth 2 (two) times daily. 07/07/17  Yes Briant CedarEzenduka, Nkeiruka J, MD  donepezil (ARICEPT) 10 MG tablet Take 10 mg by mouth at bedtime.   Yes [provider]  enoxaparin (LOVENOX) 40 MG/0.4ML injection Inject 40 mg into the skin daily.   Yes [provider]  escitalopram (LEXAPRO) 10 MG tablet Take 15 mg by mouth daily.    Yes [provider]  memantine (NAMENDA) 10 MG  tablet Take 10 mg by mouth 2 (two) times daily.   Yes [provider]  Multiple Vitamins-Minerals (PRESERVISION AREDS PO) Take 1 capsule by mouth daily.    Yes [provider]  simvastatin (ZOCOR) 40 MG tablet Take 40 mg by mouth daily.   Yes [provider]  traZODone (DESYREL) 50 MG tablet Take 50 mg by mouth at bedtime.   Yes [provider]  vitamin B-12 (CYANOCOBALAMIN) 500 MCG tablet Take 500 mcg by mouth daily.    Yes [provider]  acetaminophen (TYLENOL) 325 MG tablet Take 650 mg by mouth every 8 (eight) hours as needed for mild pain.    [provider]  bisacodyl (DULCOLAX) 10 MG suppository Place 10 mg rectally as needed for moderate constipation (If not relived by MOM).    [provider]  magnesium hydroxide (MILK OF MAGNESIA) 400 MG/5ML suspension Take 30 mLs by  mouth daily as needed for mild constipation.    [provider]  meclizine (ANTIVERT) 12.5 MG tablet Take 12.5 mg by mouth 3 (three) times daily as needed for dizziness.    [provider]  polyvinyl alcohol (LIQUIFILM TEARS) 1.4 % ophthalmic solution Place 1 drop into both eyes 3 (three) times daily as needed for dry eyes.    [provider]  Sodium Phosphates (RA SALINE ENEMA) 19-7 GM/118ML ENEM Place 1 each rectally daily as needed (if not relieved by Bisacodyl).    [provider]  triamcinolone cream (KENALOG) 0.1 % Apply 1 application topically daily as needed (for itching).     [provider]   No results found.  Positive ROS: All other systems have been reviewed and were otherwise negative with the exception of those mentioned in the HPI and as above.  Physical Exam: General: Alert, no acute distress Cardiovascular: No pedal edema Respiratory: No cyanosis, no use of accessory musculature GI: No organomegaly, abdomen is soft and non-tender Skin: No lesions in the area of chief complaint Neurologic: Sensation intact distally Psychiatric: Patient is competent for consent with normal mood and affect Lymphatic: No axillary or cervical lymphadenopathy  MUSCULOSKELETAL: Examination of the left hip reveals that she has a large eschar of the entire length of the incision.  Has the appearance that it has been picked at. She has some serosanguineous fluid in the distal aspect.  No pain with passive range of motion.  Neurovascularly intact.  Assessment: Wound dehiscence, left hip.   Plan:  I discussed the findings with the patient and her family.  We will plan for debridement and closure, possible head ball exchange if there is any deep involvement.  We will bring her into the hospital postoperatively for IV antibiotics.  All questions were solicited and answered.    Jonette Pesa, MD Cell [phone]    08/06/2017 10:15 AM

## 2017-08-06 NOTE — Anesthesia Procedure Notes (Signed)
Procedure Name: LMA Insertion Date/Time: 08/06/2017 1:54 PM Performed by: Illene SilverEvans, Yitzchok Carriger E, CRNA Pre-anesthesia Checklist: Patient identified, Emergency Drugs available, Suction available and Patient being monitored Patient Re-evaluated:Patient Re-evaluated prior to induction Oxygen Delivery Method: Circle system utilized Preoxygenation: Pre-oxygenation with 100% oxygen Induction Type: IV induction Ventilation: Mask ventilation without difficulty LMA: LMA with gastric port inserted LMA Size: 4.0 Tube type: Oral Number of attempts: 1 Airway Equipment and Method: Stylet and Oral airway Placement Confirmation: positive ETCO2 Tube secured with: Tape Dental Injury: Teeth and Oropharynx as per pre-operative assessment

## 2017-08-06 NOTE — Progress Notes (Signed)
Pharmacy Antibiotic Note  Dawn Gordon is a 84 y.o. female admitted on 08/06/2017 with wound infection.  Pharmacy has been consulted for vancomycin + cefazolin dosing. Patient received cefazolin 2 g IV once.  Patient underwent left hip hemiarthroplasty on 07/03/17 and has picking at incision which has subsequently become infected. Underwent debridement on 08/06/17.   08/06/17, 08/06/17  WBC 10.6  Afebrile  CrCl ~34 mL/min  Plan:  Cefazolin 500 mg IV q12h  Vancomycin 750 mg IV q36h  Goal AUC 400-500  Follow renal function and cultures  Follow vancomycin levels at steady state  Height: 5' (152.4 cm) Weight: 132 lb (59.9 kg) IBW/kg (Calculated) : 45.5  Temp (24hrs), Avg:98.1 F (36.7 C), Min:97.5 F (36.4 C), Max:98.9 F (37.2 C)  Recent Labs  Lab 08/06/17 0932 08/06/17 1704  WBC 7.2 10.6*  CREATININE  --  0.82    Estimated Creatinine Clearance: 34 mL/min (by C-G formula based on SCr of 0.82 mg/dL).    No Known Allergies  Antimicrobials this admission: cefazolin 6/6 >>  vancomycin 6/6 >>   Dose adjustments this admission:  Microbiology results:  6/6 Surgical/wound: Pending  Thank you for allowing pharmacy to be a part of this patient's care.  Keturah BarreMary Daily Doe, PharmD, BCPS Clinical Pharmacist Pager: [phone] 08/06/17 6:44 PM

## 2017-08-06 NOTE — Anesthesia Postprocedure Evaluation (Signed)
Anesthesia Post Note  Patient: Dawn Gordon  Procedure(s) Performed: IRRIGATION AND DEBRIDEMENT LEFT HIP (Left Hip)     Patient location during evaluation: PACU Anesthesia Type: General Level of consciousness: awake and alert Pain management: pain level controlled Vital Signs Assessment: post-procedure vital signs reviewed and stable Respiratory status: spontaneous breathing, nonlabored ventilation and respiratory function stable Cardiovascular status: blood pressure returned to baseline and stable Postop Assessment: no apparent nausea or vomiting Anesthetic complications: no    Last Vitals:  Vitals:   08/06/17 1615 08/06/17 1630  BP: (!) 153/86 (!) 155/85  Pulse: 79 77  Resp: 13 14  Temp: (!) 36.4 C 36.5 C  SpO2: 99% 100%    Last Pain:  Vitals:   08/06/17 1630  TempSrc:   PainSc: Asleep                 Beryle Lathehomas E Jnaya Butrick

## 2017-08-06 NOTE — Op Note (Signed)
OPERATIVE REPORT   08/06/2017  3:31 PM  PATIENT:  Dawn Gordon   SURGEON:  Jonette PesaBrian J Dellis Voght, MD  ASSISTANT: Skip MayerBlair Roberts, PA-C.   PREOPERATIVE DIAGNOSIS:  Left hip wound dehiscence.  POSTOPERATIVE DIAGNOSIS:  Same.  PROCEDURE: Irrigation debridement of skin and subcutaneous tissue left hip with primary closure. Aspiration left hip joint.  ANESTHESIA:   GETA.  ANTIBIOTICS: 2 g Ancef.  IMPLANTS: None.  EXPLANTS: None.  SPECIMENS:  1.  Left hip superficial wound for culture. 2. Left hip synovial fluid for culture.    COMPLICATIONS: None.  DISPOSITION: Stable to PACU.  SURGICAL INDICATIONS:  [name] is a 84 y.o. female with a history of severe dementia who underwent left hip hemiarthroplasty approximately 4 weeks ago.  Patient was apparently picking her incision, and she suffered a dehiscence and was beginning to develop a superficial infection.  The risks, benefits, and alternatives were discussed with the patient preoperatively including but not limited to the risks of infection, bleeding, nerve / blood vessel injury, cardiopulmonary complications, the need for repeat surgery, among others, and the patient was willing to proceed.  PROCEDURE IN DETAIL: The patient was identified in the holding area using 2 identifiers.  The surgical site was marked by myself.  She was taken to the operating room, and general anesthesia was induced on the stretcher.  She was then positioned on the Hana table.  All bony prominences were well-padded.  The left hip was prepped and draped in the normal sterile surgical fashion.  Timeout was called, verifying site and site of surgery.  I began by examining her left hip.  Through the whole length of the incision, she had an eschar present.  Distally, she did have some periwound erythema and a small amount of seropurulent drainage.  I used a #10 blade to excisionally debride the entire eschar out in full thickness fashion including skin and  subcutaneous fat.  The wound bed was superficial in nature, and did not proceed to the fascia.  I cultured the wound bed.  I then sharply dissected until I reached the fascial incision.  Fascial stitches were completely intact.  There was no rent or drainage.  The wound was then irrigated with antibiotic  solution followed by 3 L of normal saline with pulse lavage.  Through a separate area medial to the surgical site, I inserted an 18-gauge spinal needle until I felt the neck of the prosthesis.  I aspirated about 3 cc of serosanguineous joint fluid which I sent for culture.  The wound was then closed in layers using 2-0 Monocryl for the deep dermal layer and 2-0 nylon vertical mattress sutures for the skin.  Incisional VAC was created using Adaptic and black granufoam sponge.  There was excellent seal.  Sponge, needle, and instrument counts were correct at the end of the case x2.  There were no known complications.  POSTOPERATIVE PLAN: Postoperatively, we will admit the patient to the hospital for IV antibiotics.  We will watch her Intra-Op cultures.  She may weight-bear as tolerated with a walker.  I will plan to convert her to a Prevena incisional wound VAC upon discharge.

## 2017-08-06 NOTE — Progress Notes (Signed)
Patients code status is ordered as Full Code per family she is a DNR and Yellow DNR form located on shadow chart from facility. Made Amber Constable,PA and Caitlin,RN night shift nurse aware.

## 2017-08-06 NOTE — Transfer of Care (Signed)
Immediate Anesthesia Transfer of Care Note  Patient: Dawn Gordon  Procedure(s) Performed: IRRIGATION AND DEBRIDEMENT LEFT HIP (Left Hip)  Patient Location: PACU  Anesthesia Type:General  Level of Consciousness: drowsy, patient cooperative and responds to stimulation  Airway & Oxygen Therapy: Patient Spontanous Breathing and Patient connected to face mask oxygen  Post-op Assessment: Report given to RN and Post -op Vital signs reviewed and stable  Post vital signs: Reviewed and stable  Last Vitals:  Vitals Value Taken Time  BP 160/87 08/06/2017  3:38 PM  Temp    Pulse 83 08/06/2017  3:41 PM  Resp 15 08/06/2017  3:41 PM  SpO2 99 % 08/06/2017  3:41 PM  Vitals shown include unvalidated device data.  Last Pain:  Vitals:   08/06/17 0859  TempSrc: Oral         Complications: No apparent anesthesia complications

## 2017-08-07 ENCOUNTER — Encounter (HOSPITAL_COMMUNITY): Payer: Self-pay | Admitting: Orthopedic Surgery

## 2017-08-07 LAB — BASIC METABOLIC PANEL
Anion gap: 7 (ref 5–15)
BUN: 15 mg/dL (ref 6–20)
CHLORIDE: 106 mmol/L (ref 101–111)
CO2: 28 mmol/L (ref 22–32)
CREATININE: 0.83 mg/dL (ref 0.44–1.00)
Calcium: 8.2 mg/dL — ABNORMAL LOW (ref 8.9–10.3)
GFR, EST NON AFRICAN AMERICAN: 59 mL/min — AB (ref >60)
Glucose, Bld: 90 mg/dL (ref 65–99)
POTASSIUM: 4.2 mmol/L (ref 3.5–5.1)
SODIUM: 141 mmol/L (ref 135–145)

## 2017-08-07 LAB — CBC
HEMATOCRIT: 32.3 % — AB (ref 36.0–46.0)
Hemoglobin: 10 g/dL — ABNORMAL LOW (ref 12.0–15.0)
MCH: 30.3 pg (ref 26.0–34.0)
MCHC: 31 g/dL (ref 30.0–36.0)
MCV: 97.9 fL (ref 78.0–100.0)
PLATELETS: 194 10*3/uL (ref 150–400)
RBC: 3.3 MIL/uL — AB (ref 3.87–5.11)
RDW: 15.5 % (ref 11.5–15.5)
WBC: 6.9 10*3/uL (ref 4.0–10.5)

## 2017-08-07 MED ORDER — ENSURE ENLIVE PO LIQD
237.0000 mL | ORAL | Status: DC
  Administered 2017-08-07 – 2017-08-10 (×4): 237 mL via ORAL

## 2017-08-07 MED ORDER — CEFAZOLIN SODIUM-DEXTROSE 1-4 GM/50ML-% IV SOLN
1.0000 g | Freq: Two times a day (BID) | INTRAVENOUS | Status: DC
  Administered 2017-08-07 – 2017-08-10 (×6): 1 g via INTRAVENOUS
  Filled 2017-08-07 (×7): qty 50

## 2017-08-07 NOTE — Evaluation (Signed)
Physical Therapy Evaluation Patient Details Name: Dawn Gordon MRN: [id number] DOB: [birth year] Today's Date: 08/07/2017   History of Present Illness  Postoperative wound dehiscence, s/p I and D L hip; PMH: dementia, L hip DA hemiarthroplasty  Clinical Impression  Pt admitted with above diagnosis. Pt currently with functional limitations due to the deficits listed below (see PT Problem List). Recommend pt return to SNF and continue therapy  to maximize mobility; Pt will benefit from skilled PT to increase their independence and safety with mobility to allow discharge to the venue listed below.       Follow Up Recommendations SNF    Equipment Recommendations  None recommended by PT    Recommendations for Other Services       Precautions / Restrictions Precautions Precautions: Fall Restrictions Weight Bearing Restrictions: No Other Position/Activity Restrictions: WBAT      Mobility  Bed Mobility Overal bed mobility: Needs Assistance Bed Mobility: Supine to Sit     Supine to sit: Mod assist;+2 for physical assistance     General bed mobility comments: assist with trunk and LEs  Transfers Overall transfer level: Needs assistance Equipment used: Rolling walker (2 wheeled) Transfers: Sit to/from BJ'sStand;Stand Pivot Transfers Sit to Stand: Min assist;+2 physical assistance;+2 safety/equipment Stand pivot transfers: Min assist;+2 physical assistance;+2 safety/equipment       General transfer comment: assist to rise and stabilize, cues for sequencing and hand placement; pivotal steps to chair, fwd and back  Ambulation/Gait                Stairs            Wheelchair Mobility    Modified Rankin (Stroke Patients Only)       Balance                                             Pertinent Vitals/Pain Pain Assessment: Faces Faces Pain Scale: Hurts little more Pain Descriptors / Indicators: Discomfort Pain Intervention(s): Monitored  during session;Limited activity within patient's tolerance;Repositioned    Home Living Family/patient expects to be discharged to:: Skilled nursing facility                      Prior Function           Comments: pt unable to provide info     Hand Dominance        Extremity/Trunk Assessment        Lower Extremity Assessment Lower Extremity Assessment: LLE deficits/detail LLE Deficits / Details: AAROM L hip flexion to 45*, knee to 65*, limited by pain       Communication      Cognition Arousal/Alertness: Awake/alert Behavior During Therapy: WFL for tasks assessed/performed Overall Cognitive Status: History of cognitive impairments - at baseline                                        General Comments      Exercises     Assessment/Plan    PT Assessment Patient needs continued PT services  PT Problem List Decreased strength;Decreased activity tolerance;Decreased balance;Decreased mobility;Decreased cognition       PT Treatment Interventions DME instruction;Gait training;Functional mobility training;Therapeutic activities;Therapeutic exercise;Patient/family education    PT Goals (Current goals can be found in the Care  Plan section)  Acute Rehab PT Goals Patient Stated Goal: does not state PT Goal Formulation: With patient Time For Goal Achievement: 08/14/17 Potential to Achieve Goals: Good    Frequency Min 3X/week   Barriers to discharge        Co-evaluation               AM-PAC PT "6 Clicks" Daily Activity  Outcome Measure Difficulty turning over in bed (including adjusting bedclothes, sheets and blankets)?: Unable Difficulty moving from lying on back to sitting on the side of the bed? : Unable Difficulty sitting down on and standing up from a chair with arms (e.g., wheelchair, bedside commode, etc,.)?: Unable Help needed moving to and from a bed to chair (including a wheelchair)?: Total Help needed walking in  hospital room?: Total Help needed climbing 3-5 steps with a railing? : Total 6 Click Score: 6    End of Session Equipment Utilized During Treatment: Gait belt Activity Tolerance: Patient tolerated treatment well Patient left: with call bell/phone within reach   PT Visit Diagnosis: Difficulty in walking, not elsewhere classified (R26.2)    Time: 1610-9604 PT Time Calculation (min) (ACUTE ONLY): 10 min   Charges:   PT Evaluation $PT Eval Low Complexity: 1 Low     PT G CodesDrucilla Chalet, PT Pager: [phone] 08/07/2017    Drucilla Chalet 08/07/2017, 2:07 PM

## 2017-08-07 NOTE — Progress Notes (Signed)
Initial Nutrition Assessment  DOCUMENTATION CODES:   Not applicable  INTERVENTION:  - Will order Ensure Enlive once/day, this supplement provides 350 kcal and 20 grams of protein. - Continue to encourage PO intakes.   NUTRITION DIAGNOSIS:   Increased nutrient needs related to wound healing, hip fracture as evidenced by estimated needs.  GOAL:   Patient will meet greater than or equal to 90% of their needs  MONITOR:   PO intake, Supplement acceptance, Weight trends, Labs, Skin  REASON FOR ASSESSMENT:   Malnutrition Screening Tool  ASSESSMENT:   84 y.o. female with advanced dementia who underwent left hip hemiarthroplasty on 07/03/2017.  She was apparently picking her incision, and it opened and became infected.  She was afebrile PTA and had not been complaining of any hip pain.   BMI indicates overweight status (appropriate for advanced age). Per chart review, pt consumed 40% of dinner last night and 75% of breakfast this AM. Pt is noted to be a/o to self only and responded "I don't know" to simple questions (such as if she was having any belly/stomach discomfort) asked by RD. No family/visitors present at this time. Spoke with RN who confirmed intakes documented in the flow sheet. She states that pt is able to hold items, including cup, on her own and that she does not cough or pocket items when eating.  Yesterday patient underwent I&D of skin and subcutaneous tissue to L hip, aspiration of L hip joint.    Medications reviewed; 100 mg Colace BID, 1 tablet Prosight/day.  Labs reviewed; Ca: 8.2 mg/dL.  IVF: NS @ 50 mL/hr.      NUTRITION - FOCUSED PHYSICAL EXAM:  Completed; upper body only assessed. No muscle and no fat wasting to upper body.   Diet Order:   Diet Order           Diet regular Room service appropriate? Yes; Fluid consistency: Thin  Diet effective now          EDUCATION NEEDS:   No education needs have been identified at this time  Skin:  Skin  Assessment: Skin Integrity Issues: Skin Integrity Issues:: Incisions Incisions: R hip 6/6  Last BM:  PTA/unknown  Height:   Ht Readings from Last 1 Encounters:  08/06/17 5' (1.524 m)    Weight:   Wt Readings from Last 1 Encounters:  08/06/17 132 lb (59.9 kg)    Ideal Body Weight:  45.45 kg  BMI:  Body mass index is 25.78 kg/m.  Estimated Nutritional Needs:   Kcal:  1375-1560 (23-26 kcal/kg)  Protein:  60-70 grams (1-1.2 grams/kg)  Fluid:  >/= 1.5 L/day    Trenton GammonJessica Sohrab Keelan, MS, RD, LDN, [facility] Inpatient Clinical Dietitian Pager # [phone] After hours/weekend pager # [phone]

## 2017-08-07 NOTE — Progress Notes (Signed)
Per Dr.Swinteck to leave foley in tonight and remove in the am.

## 2017-08-07 NOTE — Progress Notes (Signed)
Pharmacy Antibiotic Note  Tine P Haverstock is a 84 y.o. female admitted on 08/06/2017 with wound infection.  Pharmacy has been consulted for vancomycin + cefazolin dosing. Patient received cefazolin 2 g IV once.  Patient underwent left hip hemiarthroplasty on 07/03/17 and has picking at incision which has subsequently become infected. Underwent debridement on 08/06/17.   08/07/17,    WBC 6.9  Afebrile  CrCl ~34 mL/min  Plan:  Change Cefazolin to 1 gr IV q12h   Vancomycin 750 mg IV q36h  Goal AUC 400-500  Follow renal function and cultures  Follow vancomycin levels at steady state  Height: 5' (152.4 cm) Weight: 132 lb (59.9 kg) IBW/kg (Calculated) : 45.5  Temp (24hrs), Avg:97.9 F (36.6 C), Min:97.3 F (36.3 C), Max:98.4 F (36.9 C)  Recent Labs  Lab 08/06/17 0932 08/06/17 1704 08/07/17 0511  WBC 7.2 10.6* 6.9  CREATININE  --  0.82 0.83    Estimated Creatinine Clearance: 33.6 mL/min (by C-G formula based on SCr of 0.83 mg/dL).    No Known Allergies  Antimicrobials this admission: cefazolin 6/6 >>  vancomycin 6/6 >>   Dose adjustments this admission:  Microbiology results:  6/6 Surgical/wound: no organisms seen   Thank you for allowing pharmacy to be a part of this patient's care.   Adalberto ColeNikola Wonder Donaway, PharmD, BCPS Pager [phone] 08/07/2017 2:17 PM

## 2017-08-07 NOTE — Clinical Social Work Note (Signed)
Clinical Social Work Assessment  Patient Details  Name: Dawn FordRuby P Knoedler MRN: [id number] Date of Birth: October 18, 1920  Date of referral:  08/07/17               Reason for consult:  Facility Placement                Permission sought to share information with:  Family Supports Permission granted to share information::     Name::      Lurline Delim Zou  Agency::   [facility]   Relationship::   Son  SolicitorContact Information:   [phone]  Housing/Transportation Living arrangements for the past 2 months:  Skilled Building survey[facility]ursing Facility Source of Information:  Adult Children Patient Interpreter Needed:  None Criminal Activity/Legal Involvement Pertinent to Current Situation/Hospitalization:  No - Comment as needed Significant Relationships:  Adult Children Lives with:  Facility Resident Do you feel safe going back to the place where you live?  Yes Need for family participation in patient care:  Yes (Patient has alzheimer's disease)  Care giving concerns:   Wound dehiscence left hip  Patient was apparently picking her incision and it opened and became infected.   Social Worker assessment / plan:  Patient at [facility] has been at [facility] since her last admission 5/4 receiving physical therapy. CSW discussed discharge plan with patient son-Tim. The plan is for the patient to [facility] to [facility] care. Per physician the patient for wound the iVac will be changed to prevena.   Patient will need updated FL2  [facility].   Plan: Patient to discharge Monday [facility] .   Employment status:  Retired Network engineernsurance information:  Managed Care PT Recommendations:  Skilled Nursing Facility Information / Referral to community resources:  Skilled Nursing Facility  Patient/Family's Response to care:  Agreeable to care.   Patient/Family's Understanding of and Emotional Response to Diagnosis, Current Treatment, and Prognosis:  Patient has dementia. Patient son  has a good understanding of patient diagnosis and is learning about her current follow up care at discharge.   Emotional Assessment Appearance:  Appears stated age Attitude/Demeanor/Rapport:    Affect (typically observed):    Orientation:  (Responds to voice) Alcohol / Substance use:  Not Applicable Psych involvement (Current and /or in the community):  No (Comment)  Discharge Needs  Concerns to be addressed:  No discharge needs identified Readmission within the last 30 days:  Yes Current discharge risk:  Dependent with Mobility, Physical Impairment Barriers to Discharge:  English as a second language teachernsurance Authorization, Continued Medical Work up   [employer] A Harles Evetts, LCSW 08/07/2017, 11:18 AM

## 2017-08-07 NOTE — Progress Notes (Signed)
OT Cancellation Note  Patient Details Name: Dawn Gordon MRN: [id number] DOB: 07-24-1920   Cancelled Treatment:    Reason Eval/Treat Not Completed: Other (comment). Pt is returning to SNF. Will defer OT evaluation to that venue.  Dawn Gordon 08/07/2017, 10:08 AM  Dawn Gordon, OTR/L [phone] 08/07/2017

## 2017-08-07 NOTE — Plan of Care (Signed)
Attempted to review plan of care with pt, but she was sleepy and has history of dementia. She will need ongoing education and would benefit from educating family when present.

## 2017-08-07 NOTE — Progress Notes (Signed)
   08/07/17 1500  PT Visit Information  Last PT Received On 08/07/17  Assistance Needed +2 (safety/mobility progression)  History of Present Illness Postoperative wound dehiscence, s/p I and D L hip; PMH: dementia, L hip DA hemiarthroplasty  Subjective Data  Patient Stated Goal does not state  Precautions  Precautions Fall  Restrictions  Weight Bearing Restrictions No  Other Position/Activity Restrictions WBAT  Pain Assessment  Faces Pain Scale 2  Pain Location L hip  Pain Descriptors / Indicators Discomfort  Cognition  Arousal/Alertness Awake/alert  Behavior During Therapy WFL for tasks assessed/performed  Overall Cognitive Status History of cognitive impairments - at baseline  Bed Mobility  Overal bed mobility Needs Assistance  Bed Mobility Sit to Supine  Sit to supine Max assist  General bed mobility comments assist with trunk and LEs  Transfers  Overall transfer level Needs assistance  Equipment used Rolling walker (2 wheeled)  Transfers Sit to/from Stand;Stand Pivot Transfers  Sit to Stand Mod assist;Max assist  Stand pivot transfers Mod assist  General transfer comment assist to rise and stabilize, cues for sequencing and hand placement; pivotal steps  fwd and back  General Exercises - Lower Extremity  Ankle Circles/Pumps AROM;Both;10 reps  Heel Slides AROM;10 reps;Left  PT - End of Session  Equipment Utilized During Treatment Gait belt  Activity Tolerance Patient tolerated treatment well  Patient left in bed;with call bell/phone within reach;with bed alarm set   PT - Assessment/Plan  PT Plan Current plan remains appropriate  PT Visit Diagnosis Difficulty in walking, not elsewhere classified (R26.2)  PT Frequency (ACUTE ONLY) Min 3X/week  Follow Up Recommendations SNF  PT equipment None recommended by PT  AM-PAC PT "6 Clicks" Daily Activity Outcome Measure  Difficulty turning over in bed (including adjusting bedclothes, sheets and blankets)? 1  Difficulty moving  from lying on back to sitting on the side of the bed?  1  Difficulty sitting down on and standing up from a chair with arms (e.g., wheelchair, bedside commode, etc,.)? 1  Help needed moving to and from a bed to chair (including a wheelchair)? 1  Help needed walking in hospital room? 1  Help needed climbing 3-5 steps with a railing?  1  6 Click Score 6  Mobility G Code  CN  PT Goal Progression  Progress towards PT goals Progressing toward goals  Acute Rehab PT Goals  PT Goal Formulation With patient  Time For Goal Achievement 08/14/17  Potential to Achieve Goals Good  PT Time Calculation  PT Start Time (ACUTE ONLY) 1440  PT Stop Time (ACUTE ONLY) 1500  PT Time Calculation (min) (ACUTE ONLY) 20 min  PT General Charges  $$ ACUTE PT VISIT 1 Visit  PT Treatments  $Therapeutic Activity 8-22 mins

## 2017-08-07 NOTE — Progress Notes (Signed)
    Subjective:  Patient reports pain as mild to moderate.  Denies N/V/CP/SOB.   Objective:   VITALS:   Vitals:   08/06/17 2245 08/07/17 0503 08/07/17 0944 08/07/17 1304  BP: 123/66 117/70 120/64 115/61  Pulse: 92 78 73 67  Resp: 15 15 16 13   Temp: 98.2 F (36.8 C) 98.4 F (36.9 C) 98.2 F (36.8 C) 98.3 F (36.8 C)  TempSrc: Oral Oral Oral Oral  SpO2: 95% 99% 94% 97%  Weight:      Height:        NAD, oriented to self only ABD soft Sensation intact distally Intact pulses distally Dorsiflexion/Plantar flexion intact Incision: iVac intact Compartment soft   Lab Results  Component Value Date   WBC 6.9 08/07/2017   HGB 10.0 (L) 08/07/2017   HCT 32.3 (L) 08/07/2017   MCV 97.9 08/07/2017   PLT 194 08/07/2017   BMET    Component Value Date/Time   NA 141 08/07/2017 0511   K 4.2 08/07/2017 0511   CL 106 08/07/2017 0511   CO2 28 08/07/2017 0511   GLUCOSE 90 08/07/2017 0511   BUN 15 08/07/2017 0511   CREATININE 0.83 08/07/2017 0511   CALCIUM 8.2 (L) 08/07/2017 0511   GFRNONAA 59 (L) 08/07/2017 0511   GFRAA >60 08/07/2017 0511    Recent Results (from the past 240 hour(s))  Aerobic/Anaerobic Culture (surgical/deep wound)     Status: None (Preliminary result)   Collection Time: 08/06/17  2:32 PM  Result Value Ref Range Status   Specimen Description   Final    FLUID LEFT HIP Performed at [facility], [address] [place], [place] [address]    Special Requests   Final    NONE Performed at [facility], [address] [place], [place] [address]    Gram Stain   Final    RARE WBC PRESENT,BOTH PMN AND MONONUCLEAR NO ORGANISMS SEEN    Culture   Final    NO GROWTH < 24 HOURS Performed at [facility], [address] [place], [place] [address]    Report Status PENDING  Incomplete  Aerobic/Anaerobic Culture (surgical/deep wound)     Status: None (Preliminary result)   Collection Time: 08/06/17  2:47 PM  Result Value Ref Range  Status   Specimen Description   Final    SYNOVIAL LEFT HIP Performed at [facility], [address] [place], [place] [address]    Special Requests   Final    NONE Performed at [facility], [address] [place], [place] [address]    Gram Stain   Final    FEW WBC PRESENT,BOTH PMN AND MONONUCLEAR NO ORGANISMS SEEN    Culture   Final    NO GROWTH < 24 HOURS Performed at [facility], [address] [place], [place] [address]    Report Status PENDING  Incomplete     Assessment/Plan: 1 Day Post-Op   Active Problems:   Postoperative wound dehiscence   WBAT with walker DVT ppx: Lovenox, SCDs, TEDS Cont IV vanco, ancef PO pain control PT/OT Dispo: follow intraop cultures, will change iVac to prevena and d/c to SNF on monday   Iline OvenBrian J Rowan Blaker 08/07/2017, 1:13 PM   Samson FredericBrian Rayanne Padmanabhan, MD Cell [phone]

## 2017-08-08 ENCOUNTER — Other Ambulatory Visit: Payer: Self-pay

## 2017-08-08 LAB — CBC
HEMATOCRIT: 31.8 % — AB (ref 36.0–46.0)
HEMOGLOBIN: 9.6 g/dL — AB (ref 12.0–15.0)
MCH: 30.1 pg (ref 26.0–34.0)
MCHC: 30.2 g/dL (ref 30.0–36.0)
MCV: 99.7 fL (ref 78.0–100.0)
Platelets: 175 10*3/uL (ref 150–400)
RBC: 3.19 MIL/uL — ABNORMAL LOW (ref 3.87–5.11)
RDW: 15.9 % — ABNORMAL HIGH (ref 11.5–15.5)
WBC: 6.6 10*3/uL (ref 4.0–10.5)

## 2017-08-08 LAB — BASIC METABOLIC PANEL
ANION GAP: 6 (ref 5–15)
BUN: 16 mg/dL (ref 6–20)
CO2: 30 mmol/L (ref 22–32)
Calcium: 8.1 mg/dL — ABNORMAL LOW (ref 8.9–10.3)
Chloride: 109 mmol/L (ref 101–111)
Creatinine, Ser: 0.79 mg/dL (ref 0.44–1.00)
GFR calc Af Amer: >60 mL/min (ref >60)
GFR calc non Af Amer: >60 mL/min (ref >60)
GLUCOSE: 87 mg/dL (ref 65–99)
POTASSIUM: 4.4 mmol/L (ref 3.5–5.1)
Sodium: 145 mmol/L (ref 135–145)

## 2017-08-08 NOTE — Progress Notes (Signed)
Subjective: 2 Days Post-Op Procedure(s) (LRB): IRRIGATION AND DEBRIDEMENT LEFT HIP (Left)  Patient reports pain as mild to moderate.  Tolerating POs well.  Denies fever, chills, N/V, CP, SOB.  Objective:   VITALS:  Temp:  [97.8 F (36.6 C)-98.4 F (36.9 C)] 97.8 F (36.6 C) (06/08 0600) Pulse Rate:  [67-73] 71 (06/08 0600) Resp:  [12-16] 15 (06/08 0600) BP: (115-146)/(58-78) 146/78 (06/08 0600) SpO2:  [94 %-97 %] 97 % (06/08 0600)  General: WDWN patient in NAD. Psych:  Appropriate mood and affect. Neuro:  A&O x 3, Moving all extremities, sensation intact to light touch HEENT:  EOMs intact Chest:  Even non-labored respirations Skin: Dressing C/D/I, no rashes or lesions Extremities: warm/dry, mild edema, no erythema or echymosis.  No lymphadenopathy. Pulses: Popliteus 2+ MSK:  ROM: TKE, MMT: able to perform quad set, (-) Homan's    LABS Recent Labs    08/06/17 0932 08/06/17 1704 08/07/17 0511 08/08/17 0543  HGB 11.7* 11.6* 10.0* 9.6*  WBC 7.2 10.6* 6.9 6.6  PLT 202 157 194 175   Recent Labs    08/07/17 0511 08/08/17 0543  NA 141 145  K 4.2 4.4  CL 106 109  CO2 28 30  BUN 15 16  CREATININE 0.83 0.79  GLUCOSE 90 87   No results for input(s): LABPT, INR in the last 72 hours.   Assessment/Plan: 2 Days Post-Op Procedure(s) (LRB): IRRIGATION AND DEBRIDEMENT LEFT HIP (Left)  Patient seen in rounds for Dr. Linde GillisSwinteck WBAT L LE with walker Up with therapy Lovenox for DVT prophylaxis Cultures pending Continue IV vanc, ancef Disp: SNF on Monday likely Plan on 2 week outpatient post-op visit with Dr. Noralee CharsSwinteck   Idamay Hosein PA-C [facility] Office:  [phone]

## 2017-08-09 LAB — BASIC METABOLIC PANEL
ANION GAP: 7 (ref 5–15)
BUN: 15 mg/dL (ref 6–20)
CHLORIDE: 104 mmol/L (ref 101–111)
CO2: 31 mmol/L (ref 22–32)
Calcium: 8.3 mg/dL — ABNORMAL LOW (ref 8.9–10.3)
Creatinine, Ser: 0.83 mg/dL (ref 0.44–1.00)
GFR calc Af Amer: >60 mL/min (ref >60)
GFR, EST NON AFRICAN AMERICAN: 59 mL/min — AB (ref >60)
Glucose, Bld: 85 mg/dL (ref 65–99)
POTASSIUM: 3.7 mmol/L (ref 3.5–5.1)
SODIUM: 142 mmol/L (ref 135–145)

## 2017-08-09 LAB — CBC
HCT: 34.7 % — ABNORMAL LOW (ref 36.0–46.0)
Hemoglobin: 10.8 g/dL — ABNORMAL LOW (ref 12.0–15.0)
MCH: 30.5 pg (ref 26.0–34.0)
MCHC: 31.1 g/dL (ref 30.0–36.0)
MCV: 98 fL (ref 78.0–100.0)
PLATELETS: 208 10*3/uL (ref 150–400)
RBC: 3.54 MIL/uL — ABNORMAL LOW (ref 3.87–5.11)
RDW: 15.7 % — AB (ref 11.5–15.5)
WBC: 6.7 10*3/uL (ref 4.0–10.5)

## 2017-08-09 NOTE — Progress Notes (Signed)
About 10:30 pm, patient attempting to get out of bed.  Had removed the Purewick and taken off the gauze dressing on left hip.  Purewick replaced, dressing replaced and Tylenol given to help with discomfort.

## 2017-08-09 NOTE — Progress Notes (Signed)
Dawn FordRuby P Gordon  MRN: [id number] DOB/Age: 1920-09-05 84 y.o. Physician: Dawn Gordon, M.D. 3 Days Post-Op Procedure(s) (LRB): IRRIGATION AND DEBRIDEMENT LEFT HIP (Left)  Subjective: Sitting up in bed, no c/o, flat affect, limited communication Vital Signs Temp:  [97.8 F (36.6 C)-98.9 F (37.2 C)] 97.8 F (36.6 C) (06/09 0631) Pulse Rate:  [70-72] 72 (06/09 0633) Resp:  [16] 16 (06/09 0631) BP: (134-168)/(64-86) 168/86 (06/09 0633) SpO2:  [94 %-96 %] 96 % (06/09 0633)  Lab Results Recent Labs    08/08/17 0543 08/09/17 0455  WBC 6.6 6.7  HGB 9.6* 10.8*  HCT 31.8* 34.7*  PLT 175 208   BMET Recent Labs    08/08/17 0543 08/09/17 0455  NA 145 142  K 4.4 3.7  CL 109 104  CO2 30 31  GLUCOSE 87 85  BUN 16 15  CREATININE 0.79 0.83  CALCIUM 8.1* 8.3*   INR  Date Value Ref Range Status  07/02/2017 1.51  Final    Comment:    Performed at [facility], [address] [place], [place] [address]     Exam  Left hip incision without drainage, erythema, or induration. Wound vac removed by patient, dry gauze dressing applied, n/v intact distally  Plan SNF Dawn Gordon 08/09/2017, 9:42 AM    Contact # [phone]

## 2017-08-10 ENCOUNTER — Non-Acute Institutional Stay (SKILLED_NURSING_FACILITY): Payer: Medicare Other | Admitting: Internal Medicine

## 2017-08-10 ENCOUNTER — Encounter: Payer: Self-pay | Admitting: Internal Medicine

## 2017-08-10 DIAGNOSIS — F329 Major depressive disorder, single episode, unspecified: Secondary | ICD-10-CM

## 2017-08-10 DIAGNOSIS — E785 Hyperlipidemia, unspecified: Secondary | ICD-10-CM

## 2017-08-10 DIAGNOSIS — F5101 Primary insomnia: Secondary | ICD-10-CM

## 2017-08-10 DIAGNOSIS — F32A Depression, unspecified: Secondary | ICD-10-CM

## 2017-08-10 DIAGNOSIS — G301 Alzheimer's disease with late onset: Secondary | ICD-10-CM | POA: Diagnosis not present

## 2017-08-10 DIAGNOSIS — T8131XD Disruption of external operation (surgical) wound, not elsewhere classified, subsequent encounter: Secondary | ICD-10-CM | POA: Diagnosis not present

## 2017-08-10 DIAGNOSIS — F0280 Dementia in other diseases classified elsewhere without behavioral disturbance: Secondary | ICD-10-CM

## 2017-08-10 MED ORDER — AMOXICILLIN 500 MG PO CAPS: 500.0000 mg | ORAL_CAPSULE | Freq: Three times a day (TID) | ORAL | 0 refills | Status: AC

## 2017-08-10 NOTE — [place] FL2 (Signed)
[place] MEDICAID FL2 LEVEL OF CARE SCREENING TOOL     IDENTIFICATION  Patient Name: Dawn Gordon Birthdate: Jun 05, 1920 Sex: female Admission Date (Current Location): 08/06/2017  [facility] [place]   Facility and Address:  [facility],  [address], [place] [address]      Provider Number: [id number]  Attending Physician Name and Address:  Samson FredericSwinteck, Brian, MD  Relative Name and Phone Number:       Current Level of Care: Hospital Recommended Level of Care: Skilled Nursing Facility Prior Approval Number:    Date Approved/Denied:   PASRR Number: [id number]  Discharge Plan: SNF    Current Diagnoses: Patient Active Problem List   Diagnosis Date Noted  . Postoperative wound dehiscence 08/06/2017  . H/O total hip arthroplasty, left 07/11/2017  . Acute blood loss as cause of postoperative anemia 07/11/2017  . Hyperlipidemia 07/11/2017  . Depression 07/11/2017  . Vertigo 07/11/2017  . Closed left hip fracture, initial encounter (HCC) 07/03/2017  . Alzheimer's dementia without behavioral disturbance 07/03/2017  . Displaced fracture of left femoral neck (HCC) 07/03/2017    Orientation RESPIRATION BLADDER Height & Weight     Self  Normal Incontinent Weight: 132 lb (59.9 kg) Height:  5' (152.4 cm)  BEHAVIORAL SYMPTOMS/MOOD NEUROLOGICAL BOWEL NUTRITION STATUS      Continent Diet(regular)  AMBULATORY STATUS COMMUNICATION OF NEEDS Skin   Limited Assist Verbally Other (Comment)(Incision(Closed)HipLeft Gauze Dressing Changed Daily)                       Personal Care Assistance Level of Assistance  Bathing, Feeding, Dressing Bathing Assistance: Maximum assistance Feeding assistance: Independent Dressing Assistance: Maximum assistance     Functional Limitations Info  Sight, Hearing, Speech Sight Info: Impaired Hearing Info: Impaired Speech Info: Adequate    SPECIAL CARE FACTORS FREQUENCY  PT (By licensed PT), OT (By  licensed OT)     PT Frequency: 5x/week OT Frequency: 5x/week            Contractures Contractures Info: Not present    Additional Factors Info  Code Status, Allergies, Psychotropic Code Status Info: DNR Allergies Info: NKA Psychotropic Info: yes, see medication list.         Current Medications (08/10/2017):  This is the current hospital active medication list Current Facility-Administered Medications  Medication Dose Route Frequency Provider Last Rate Last Dose  . 0.9 %  sodium chloride infusion   Intravenous Continuous Samson FredericSwinteck, Brian, MD 20 mL/hr at 08/08/17 2100    . acetaminophen (TYLENOL) tablet 650 mg  650 mg Oral Q6H PRN Samson FredericSwinteck, Brian, MD   650 mg at 08/09/17 2305  . bisacodyl (DULCOLAX) suppository 10 mg  10 mg Rectal PRN Samson FredericSwinteck, Brian, MD      . buPROPion [facility](WELLBUTRIN) tablet 75 mg  75 mg Oral BID Samson FredericSwinteck, Brian, MD   75 mg at 08/10/17 1026  . ceFAZolin (ANCEF) IVPB 1 g/50 mL premix  1 g Intravenous Q12H Adalberto ColeGlogovac, Nikola, RPH   Stopped at 08/10/17 [id number]  . docusate sodium (COLACE) capsule 100 mg  100 mg Oral BID Samson FredericSwinteck, Brian, MD   100 mg at 08/10/17 1026  . donepezil (ARICEPT) tablet 10 mg  10 mg Oral QHS Samson FredericSwinteck, Brian, MD   10 mg at 08/09/17 2145  . enoxaparin (LOVENOX) injection 40 mg  40 mg Subcutaneous Q24H Samson FredericSwinteck, Brian, MD   40 mg at 08/10/17 0847  . escitalopram (LEXAPRO) tablet 15 mg  15 mg Oral Daily  Samson Frederic, MD   15 mg at 08/10/17 1026  . feeding supplement (ENSURE ENLIVE) (ENSURE ENLIVE) liquid 237 mL  237 mL Oral Q24H Samson Frederic, MD   237 mL at 08/10/17 1320  . magnesium hydroxide (MILK OF MAGNESIA) suspension 30 mL  30 mL Oral Daily PRN Swinteck, Arlys John, MD      . meclizine (ANTIVERT) tablet 12.5 mg  12.5 mg Oral TID PRN Samson Frederic, MD      . memantine [facility]) tablet 10 mg  10 mg Oral BID Samson Frederic, MD   10 mg at 08/10/17 1026  . metoCLOPramide (REGLAN) tablet 5-10 mg  5-10 mg Oral Q8H PRN Swinteck, Arlys John, MD       Or  .  metoCLOPramide (REGLAN) injection 5-10 mg  5-10 mg Intravenous Q8H PRN Swinteck, Arlys John, MD      . multivitamin (PROSIGHT) tablet 1 tablet  1 tablet Oral Daily Samson Frederic, MD   1 tablet at 08/10/17 1026  . ondansetron (ZOFRAN) tablet 4 mg  4 mg Oral Q6H PRN Swinteck, Arlys John, MD       Or  . ondansetron (ZOFRAN) injection 4 mg  4 mg Intravenous Q6H PRN Swinteck, Arlys John, MD      . polyvinyl alcohol (LIQUIFILM TEARS) 1.4 % ophthalmic solution 1 drop  1 drop Both Eyes TID PRN Swinteck, Arlys John, MD      . simvastatin (ZOCOR) tablet 40 mg  40 mg Oral Daily Samson Frederic, MD   40 mg at 08/10/17 1026  . sodium phosphate (FLEET) 7-19 GM/118ML enema 1 enema  1 enema Rectal Daily PRN Samson Frederic, MD      . traZODone (DESYREL) tablet 50 mg  50 mg Oral QHS Samson Frederic, MD   50 mg at 08/09/17 2146  . vancomycin (VANCOCIN) IVPB 750 mg/150 ml premix  750 mg Intravenous Q36H Cindi Carbon, [facility]   Stopped at 08/09/17 1907     Discharge Medications: Please see discharge summary for a list of discharge medications.  Relevant Imaging Results:  Relevant Lab Results:   Additional Information ssn:[id number]  Antionette Poles, LCSW

## 2017-08-10 NOTE — Progress Notes (Signed)
:   provider: Margit HanksAnne D Sonia Bromell MD Location:  [facility] Room Number: 108-P Place of Service:  SNF (31)   Margit HanksAnne D Makinzey Banes, MD  PCP: System, Pcp Not In Patient Care Team: System, Pcp Not In as PCP - General  Extended Emergency Contact Information Primary Emergency Contact: Birkhead,TIM Address: [address]          [place], [place] [address] [place] Home Phone: [phone] Relation: Son     Allergies: Patient has no known allergies.  Chief Complaint  Patient presents with  . New Admit To SNF    [facility]    HPI: Patient is 84 y.o. female with Alzheimer's disease, depression, hyperlipidemia, overactive bladder, who [facility] from [facility]10 where she was admitted with a diagnosis of postoperative wound dehiscence and went to the operating room on 6/6 and underwent an irrigation and debridement of her left hip.  Intraoperative superficial wound culture grew strep viridans, deep synovial fluid culture was negative she was given IV vancomycin then transition to oral amoxicillin the patient's incisional wound VAC was removed the night of postop day 3 and patient was considered stable to be admitted to skilled nursing facility.  Patient is admitted to skilled nursing facility for OT/PT.  While at skilled nursing facility patient will be followed for depression treated with Wellbutrin and Lexapro, hyperlipidemia treated with Zocor and dementia treated with Namenda.  Past Medical History:  Diagnosis Date  . Alzheimers disease   . Amnesia   . Depression   . H/O fracture of left hip 07/03/2017  . Hyperlipidemia   . Overactive bladder   . Sleep apnea    no cpap used  . Vertigo     Past Surgical History:  Procedure Laterality Date  . ANKLE SURGERY  yrs ago  . ANTERIOR APPROACH HEMI HIP ARTHROPLASTY Left 07/03/2017   Procedure: ANTERIOR APPROACH HEMI HIP ARTHROPLASTY;  Surgeon: Samson FredericSwinteck, Brian, MD;   Location: [facility]S;  Service: Orthopedics;  Laterality: Left;  . BACK SURGERY     yrs ago  . INCISION AND DRAINAGE HIP Left 08/06/2017   Procedure: IRRIGATION AND DEBRIDEMENT LEFT HIP;  Surgeon: Samson FredericSwinteck, Brian, MD;  Location: [facility];  Service: Orthopedics;  Laterality: Left;  . KNEE SURGERY     replacement not sure which sure    Allergies as of 08/10/2017   No Known Allergies     Medication List    Notice   This visit is during an admission. Changes to the med list made in this visit will be reflected in the After Visit Summary of the admission.    Current Outpatient Medications on File Prior to Visit  Medication Sig Dispense Refill  . acetaminophen (TYLENOL) 325 MG tablet Take 650 mg by mouth every 8 (eight) hours as needed for mild pain.    Marland Kitchen. amoxicillin (AMOXIL) 500 MG capsule Take 1 capsule (500 mg total) by mouth 3 (three) times daily for 14 days. 42 capsule 0  . bisacodyl (DULCOLAX) 10 MG suppository Place 10 mg rectally as needed for moderate constipation (If not relived by MOM).    Marland Kitchen. buPROPion (WELLBUTRIN) 75 MG tablet Take 75 mg by mouth 2 (two) times daily.    Marland Kitchen. docusate sodium (COLACE) 100 MG capsule Take 1 capsule (100 mg total) by mouth 2 (two) times daily. 10 capsule 0  . donepezil (ARICEPT) 10 MG tablet Take 10 mg by mouth at  bedtime.    Marland Kitchen escitalopram (LEXAPRO) 10 MG tablet Take 15 mg by mouth daily.     . magnesium hydroxide (MILK OF MAGNESIA) 400 MG/5ML suspension Take 30 mLs by mouth daily as needed for mild constipation.    . meclizine (ANTIVERT) 12.5 MG tablet Take 12.5 mg by mouth 3 (three) times daily as needed for dizziness.    . memantine (NAMENDA) 10 MG tablet Take 10 mg by mouth 2 (two) times daily.    . Multiple Vitamins-Minerals (PRESERVISION AREDS PO) Take 1 capsule by mouth daily.     . polyvinyl alcohol (LIQUIFILM TEARS) 1.4 % ophthalmic solution Place 1 drop into both eyes 3 (three) times daily as needed for dry eyes.    . simvastatin (ZOCOR) 40 MG tablet  Take 40 mg by mouth daily.    . Sodium Phosphates (RA SALINE ENEMA) 19-7 GM/118ML ENEM Place 1 each rectally daily as needed (if not relieved by Bisacodyl).    . traZODone (DESYREL) 50 MG tablet Take 50 mg by mouth at bedtime.    . triamcinolone cream (KENALOG) 0.1 % Apply 1 application topically daily as needed (for itching).     . vitamin B-12 (CYANOCOBALAMIN) 500 MCG tablet Take 500 mcg by mouth daily.      No current facility-administered medications on file prior to visit.      No orders of the defined types were placed in this encounter.   Immunization History  Administered Date(s) Administered  . Influenza, High Dose Seasonal PF 11/13/2014, 12/13/2015, 12/07/2016  . PPD Test 11/20/2015  . Pneumococcal Conjugate-13 09/12/2013  . Pneumococcal Polysaccharide-23 01/15/1998  . Td 08/24/2007  . Zoster 01/26/2008    Social History   Tobacco Use  . Smoking status: Never Smoker  . Smokeless tobacco: Never Used  Substance Use Topics  . Alcohol use: No    Family history is   Family History  Problem Relation Age of Onset  . Cancer Father   . Macular degeneration Paternal Grandmother       Review of Systems  DATA OBTAINED: from patient, nurse GENERAL:  no fevers, fatigue, appetite changes SKIN: No itching, or rash EYES: No eye pain, redness, discharge EARS: No earache, tinnitus, change in hearing NOSE: No congestion, drainage or bleeding  MOUTH/THROAT: No mouth or tooth pain, No sore throat RESPIRATORY: No cough, wheezing, SOB CARDIAC: No chest pain, palpitations, lower extremity edema  GI: No abdominal pain, No N/V/D or constipation, No heartburn or reflux  GU: No dysuria, frequency or urgency, or incontinence  MUSCULOSKELETAL: No unrelieved bone/joint pain NEUROLOGIC: No headache, dizziness or focal weakness PSYCHIATRIC: No c/o anxiety or sadness   Vitals:   08/10/17 1604  BP: 134/77  Pulse: 78  Resp: 17  Temp: 98.4 F (36.9 C)  SpO2: 97%    SpO2  Readings from Last 1 Encounters:  08/10/17 96%   Body mass index is 25.78 kg/m.     Physical Exam  GENERAL APPEARANCE: Alert, conversant,  No acute distress.  SKIN: No diaphoresis rash HEAD: Normocephalic, atraumatic  EYES: Conjunctiva/lids clear. Pupils round, reactive. EOMs intact.  EARS: External exam WNL, canals clear. Hearing grossly normal.  NOSE: No deformity or discharge.  MOUTH/THROAT: Lips w/o lesions  RESPIRATORY: Breathing is even, unlabored. Lung sounds are clear   CARDIOVASCULAR: Heart RRR no murmurs, rubs or gallops. No peripheral edema.   GASTROINTESTINAL: Abdomen is soft, non-tender, not distended w/ normal bowel sounds. GENITOURINARY: Bladder non tender, not distended  MUSCULOSKELETAL: No abnormal joints or musculature  NEUROLOGIC:  Cranial nerves 2-12 grossly intact. Moves all extremities  PSYCHIATRIC: Mood and affect with dementia, no behavioral issues  Patient Active Problem List   Diagnosis Date Noted  . Postoperative wound dehiscence 08/06/2017  . H/O total hip arthroplasty, left 07/11/2017  . Acute blood loss as cause of postoperative anemia 07/11/2017  . Hyperlipidemia 07/11/2017  . Depression 07/11/2017  . Vertigo 07/11/2017  . Closed left hip fracture, initial encounter (HCC) 07/03/2017  . Alzheimer's dementia without behavioral disturbance 07/03/2017  . Displaced fracture of left femoral neck (HCC) 07/03/2017      Labs reviewed: Basic Metabolic Panel:    Component Value Date/Time   NA 142 08/09/2017 0455   K 3.7 08/09/2017 0455   CL 104 08/09/2017 0455   CO2 31 08/09/2017 0455   GLUCOSE 85 08/09/2017 0455   BUN 15 08/09/2017 0455   CREATININE 0.83 08/09/2017 0455   CALCIUM 8.3 (L) 08/09/2017 0455   PROT 6.8 07/02/2017 2325   ALBUMIN 3.5 07/02/2017 2325   AST 16 07/02/2017 2325   ALT 11 (L) 07/02/2017 2325   ALKPHOS 136 (H) 07/02/2017 2325   BILITOT 1.0 07/02/2017 2325   GFRNONAA 59 (L) 08/09/2017 0455   GFRAA >60 08/09/2017 0455      Recent Labs    08/07/17 0511 08/08/17 0543 08/09/17 0455  NA 141 145 142  K 4.2 4.4 3.7  CL 106 109 104  CO2 28 30 31   GLUCOSE 90 87 85  BUN 15 16 15   CREATININE 0.83 0.79 0.83  CALCIUM 8.2* 8.1* 8.3*   Liver Function Tests: Recent Labs    07/02/17 2325  AST 16  ALT 11*  ALKPHOS 136*  BILITOT 1.0  PROT 6.8  ALBUMIN 3.5   No results for input(s): LIPASE, AMYLASE in the last 8760 hours. No results for input(s): AMMONIA in the last 8760 hours. CBC: Recent Labs    07/05/17 0513 07/06/17 0447 07/07/17 0450  08/07/17 0511 08/08/17 0543 08/09/17 0455  WBC 9.8 10.0 8.9   < > 6.9 6.6 6.7  NEUTROABS 6.9 7.2 6.5  --   --   --   --   HGB 10.2* 10.2* 9.9*   < > 10.0* 9.6* 10.8*  HCT 31.7* 31.9* 31.2*   < > 32.3* 31.8* 34.7*  MCV 94.6 94.9 94.0   < > 97.9 99.7 98.0  PLT 219 211 201   < > 194 175 208   < > = values in this interval not displayed.   Lipid No results for input(s): CHOL, HDL, LDLCALC, TRIG in the last 8760 hours.  Cardiac Enzymes: No results for input(s): CKTOTAL, CKMB, CKMBINDEX, TROPONINI in the last 8760 hours. BNP: No results for input(s): BNP in the last 8760 hours. No results found for: MICROALBUR No results found for: HGBA1C No results found for: TSH No results found for: VITAMINB12 No results found for: FOLATE No results found for: IRON, TIBC, FERRITIN  Imaging and Procedures obtained prior to SNF admission: Dg C-arm 1-60 Min-no Report  Result Date: 08/06/2017 Fluoroscopy was utilized by the requesting physician.  No radiographic interpretation.     Not all labs, radiology exams or other studies done during hospitalization come through on my EPIC note; however they are reviewed by me.    Assessment and Plan  Left hip incision wound dehiscence-status post irrigation in OR; superficial cultures positive deep cultures negativePatient was started on IV vancomycin then transition to oral amoxicillin. SNF -admitted for OT/PT; continue  amoxicillin 500 3 times  daily for 14 more days  Dementia SNF -stable; continue Aricept 10 mg p.o. Nightly  Depression SNF -table; continue Lexapro 15 mg daily and Wellbutrin 75 mg twice daily  Hyperlipidemia SNF -not stated as uncontrolled; continue Zocor 40 mg daily  Insomnia SNF -continued trazodone 50 mg nightly   Time spent greater 35 minutes;> 50% of time with patient was spent reviewing records, labs, tests and studies, counseling and developing plan of care  Margit Hanks, MD

## 2017-08-10 NOTE — Progress Notes (Signed)
CSW contacted by patient's son Lurline Del(Tim Haynie [phone]). CSW provided update about patient's discharge and PTAR. CSW provided patient's son with 3E Nursing station number. CSW signing off, no other needs identified at this time.  Celso SickleKimberly Linnea [place], [place] Clinical Social Worker [facility] Cell#: [phone]

## 2017-08-10 NOTE — Care Management Important Message (Signed)
Important Message  Patient Details  Name: Dawn FordRuby P Gordon MRN: [id number] Date of Birth: 1920-08-08   Medicare Important Message Given:  Yes    Caren MacadamFuller, Kaydin Labo 08/10/2017, 2:59 PMImportant Message  Patient Details  Name: Dawn FordRuby P Gordon MRN: [id number] Date of Birth: 1920-08-08   Medicare Important Message Given:  Yes    Caren MacadamFuller, Nivan Melendrez 08/10/2017, 2:59 PM

## 2017-08-10 NOTE — Progress Notes (Signed)
Pharmacy Antibiotic Note  Dawn Gordon is a 84 y.o. female s/p left hip hemiarthroplasty on 07/03/17 with site became infected after she picked at her incision.  She underwent I&D with aspiration of L hip joint on 6/6.  She's currently on vancomycin and cefazolin for infection.  Today, 08/10/2017: - daay #5 abx - afeb, wbc wnl - scr stable at 0.83 (crcl~33) - L hip fluid cx with rate strep viridans  Plan: - continue Cefazolin 1g IV q12h and Vancomycin 750 mg IV q36h - consider de-escalate abx to  ceftriaxone - Pharmacy will plan to check vancomycin level soon if to continue with vancomycin ______________________________________  Height: 5' (152.4 cm) Weight: 132 lb (59.9 kg) IBW/kg (Calculated) : 45.5  Temp (24hrs), Avg:98.3 F (36.8 C), Min:98.1 F (36.7 C), Max:98.6 F (37 C)  Recent Labs  Lab 08/06/17 0932 08/06/17 1704 08/07/17 0511 08/08/17 0543 08/09/17 0455  WBC 7.2 10.6* 6.9 6.6 6.7  CREATININE  --  0.82 0.83 0.79 0.83    Estimated Creatinine Clearance: 33.6 mL/min (by C-G formula based on SCr of 0.83 mg/dL).    No Known Allergies  Antimicrobials this admission:  cefazolin 6/6 >>  vancomycin 6/6 >>   Dose adjustments this admission:  6/7 change ancef from 500 q12h to 1 gr q12h   Microbiology results:  6/6 L hip fluid: RARE VIRIDANS STREPTOCOCCUS  (S= CTX, LVQ, PCN, vanc; R= erythro) 6/6  left synovial fluid:  Thank you for allowing pharmacy to be a part of this patient's care.  Lucia Gaskinsham, Mats Jeanlouis P 08/10/2017 1:27 PM

## 2017-08-10 NOTE — Progress Notes (Signed)
Physical Therapy Treatment Patient Details Name: Dawn Gordon MRN: [id number] DOB: 1920-09-17 Today's Date: 08/10/2017    History of Present Illness Postoperative wound dehiscence, s/p I and D L hip; PMH: dementia, L hip DA hemiarthroplasty    PT Comments    Pt progressing, incr gait distance today; continue to recommend SNF   Follow Up Recommendations  SNF     Equipment Recommendations  None recommended by PT    Recommendations for Other Services       Precautions / Restrictions Precautions Precautions: Fall Restrictions Weight Bearing Restrictions: No Other Position/Activity Restrictions: WBAT    Mobility  Bed Mobility Overal bed mobility: Needs Assistance Bed Mobility: Supine to Sit     Supine to sit: Mod assist;+2 for physical assistance     General bed mobility comments: assist with trunk and LEs, bed pad used to scoot to EOB  Transfers Overall transfer level: Needs assistance Equipment used: Rolling walker (2 wheeled) Transfers: Sit to/from Stand Sit to Stand: Min assist;+2 physical assistance;+2 safety/equipment         General transfer comment: assist to rise and stabilize, cues for sequencing and hand placement;   Ambulation/Gait Ambulation/Gait assistance: Min assist;+2 safety/equipment;Mod assist Ambulation Distance (Feet): 22 Feet Assistive device: Rolling walker (2 wheeled) Gait Pattern/deviations: Step-to pattern     General Gait Details: cues for sequence, RW position, encouragement to incr distance   Stairs             Wheelchair Mobility    Modified Rankin (Stroke Patients Only)       Balance                                            Cognition Arousal/Alertness: Awake/alert(sleepy) Behavior During Therapy: WFL for tasks assessed/performed Overall Cognitive Status: History of cognitive impairments - at baseline                                        Exercises General Exercises -  Lower Extremity Heel Slides: AROM;10 reps;Left;AAROM    General Comments        Pertinent Vitals/Pain Pain Assessment: Faces Faces Pain Scale: Hurts a little bit Pain Location: L hip Pain Descriptors / Indicators: Discomfort Pain Intervention(s): Monitored during session    Home Living                      Prior Function            PT Goals (current goals can now be found in the care plan section) Acute Rehab PT Goals Patient Stated Goal: does not state PT Goal Formulation: With patient Time For Goal Achievement: 08/14/17 Potential to Achieve Goals: Good Progress towards PT goals: Progressing toward goals    Frequency    Min 3X/week      PT Plan Current plan remains appropriate    Co-evaluation              AM-PAC PT "6 Clicks" Daily Activity  Outcome Measure  Difficulty turning over in bed (including adjusting bedclothes, sheets and blankets)?: Unable Difficulty moving from lying on back to sitting on the side of the bed? : Unable Difficulty sitting down on and standing up from a chair with arms (e.g., wheelchair, bedside commode, etc,.)?: Unable Help needed  moving to and from a bed to chair (including a wheelchair)?: Total Help needed walking in hospital room?: A Lot Help needed climbing 3-5 steps with a railing? : Total 6 Click Score: 7    End of Session Equipment Utilized During Treatment: Gait belt Activity Tolerance: Patient tolerated treatment well Patient left: in chair;with call bell/phone within reach;with chair alarm set   PT Visit Diagnosis: Difficulty in walking, not elsewhere classified (R26.2)     Time: 1140-1153 PT Time Calculation (min) (ACUTE ONLY): 13 min  Charges:  $Gait Training: 8-22 mins                    G Codes:          Dawn Gordon 2017-08-15, 1:57 PM

## 2017-08-10 NOTE — Discharge Instructions (Signed)
Dr. Rod Can Joint Replacement Specialist [facility] [address], [place], [place] [address] [phone]   TOTAL HIP REPLACEMENT POSTOPERATIVE DIRECTIONS    Hip Rehabilitation, Guidelines Following Surgery   WEIGHT BEARING Weight bearing as tolerated with assist device (walker, cane, etc) as directed, use it as long as suggested by your surgeon or therapist, typically at least 4-6 weeks.  The results of a hip operation are greatly improved after range of motion and muscle strengthening exercises. Follow all safety measures which are given to protect your hip. If any of these exercises cause increased pain or swelling in your joint, decrease the amount until you are comfortable again. Then slowly increase the exercises. Call your caregiver if you have problems or questions.   HOME CARE INSTRUCTIONS  Most of the following instructions are designed to prevent the dislocation of your new hip.  Remove items at home which could result in a fall. This includes throw rugs or furniture in walking pathways.  Continue medications as instructed at time of discharge.  You may have some home medications which will be placed on hold until you complete the course of blood thinner medication. Do not put on socks or shoes without following the instructions of your caregivers.   Sit on chairs with arms. Use the chair arms to help push yourself up when arising.  Arrange for the use of a toilet seat elevator so you are not sitting low.   Walk with walker as instructed.  You may resume a sexual relationship in one month or when given the OK by your caregiver.  Use walker as long as suggested by your caregivers.  You may put full weight on your legs and walk as much as is comfortable. Avoid periods of inactivity such as sitting longer than an hour when not asleep. This helps prevent blood clots.  You may return to work once you are cleared by Engineer, production.  Do not  drive a car for 6 weeks or until released by your surgeon.  Do not drive while taking narcotics.  Wear elastic stockings for two weeks following surgery during the day but you may remove then at night.  Make sure you keep all of your appointments after your operation with all of your doctors and caregivers. You should call the office at the above phone number and make an appointment for approximately two weeks after the date of your surgery. Please pick up a stool softener and laxative for home use as long as you are requiring pain medications.  ICE to the affected hip every three hours for 30 minutes at a time and then as needed for pain and swelling. Continue to use ice on the hip for pain and swelling from surgery. You may notice swelling that will progress down to the foot and ankle.  This is normal after surgery.  Elevate the leg when you are not up walking on it.   It is important for you to complete the blood thinner medication as prescribed by your doctor.  Continue to use the breathing machine which will help keep your temperature down.  It is common for your temperature to cycle up and down following surgery, especially at night when you are not up moving around and exerting yourself.  The breathing machine keeps your lungs expanded and your temperature down.  RANGE OF MOTION AND STRENGTHENING EXERCISES  These exercises are designed to help you keep full movement of your hip joint. Follow your caregiver's  or physical therapist's instructions. Perform all exercises about fifteen times, three times per day or as directed. Exercise both hips, even if you have had only one joint replacement. These exercises can be done on a training (exercise) mat, on the floor, on a table or on a bed. Use whatever works the best and is most comfortable for you. Use music or television while you are exercising so that the exercises are a pleasant break in your day. This will make your life better with the exercises  acting as a break in routine you can look forward to.  Lying on your back, slowly slide your foot toward your buttocks, raising your knee up off the floor. Then slowly slide your foot back down until your leg is straight again.  Lying on your back spread your legs as far apart as you can without causing discomfort.  Lying on your side, raise your upper leg and foot straight up from the floor as far as is comfortable. Slowly lower the leg and repeat.  Lying on your back, tighten up the muscle in the front of your thigh (quadriceps muscles). You can do this by keeping your leg straight and trying to raise your heel off the floor. This helps strengthen the largest muscle supporting your knee.  Lying on your back, tighten up the muscles of your buttocks both with the legs straight and with the knee bent at a comfortable angle while keeping your heel on the floor.   SKILLED REHAB INSTRUCTIONS: If the patient is transferred to a skilled rehab facility following release from the hospital, a list of the current medications will be sent to the facility for the patient to continue.  When discharged from the skilled rehab facility, please have the facility set up the patient's Home Health Physical Therapy prior to being released. Also, the skilled facility will be responsible for providing the patient with their medications at time of release from the facility to include their pain medication and their blood thinner medication. If the patient is still at the rehab facility at time of the two week follow up appointment, the skilled rehab facility will also need to assist the patient in arranging follow up appointment in our office and any transportation needs.  MAKE SURE YOU:  Understand these instructions.  Will watch your condition.  Will get help right away if you are not doing well or get worse.  Pick up stool softner and laxative for home use following surgery while on pain medications. Daily wound care  with ABDs and tape.  Continue to use ice for pain and swelling after surgery. Do not use any lotions or creams on the incision until instructed by your surgeon. Total Hip Protocol.

## 2017-08-10 NOTE — Progress Notes (Signed)
Patient removed wound vac, picking at site.  Replaced with dry dressing for protection.

## 2017-08-10 NOTE — Plan of Care (Signed)
Patient discharged back to [facility]. [facility] Iv removed by patient, dressing to L hip changed at 1610. [facility]

## 2017-08-10 NOTE — Discharge Summary (Signed)
Physician Discharge Summary  Patient ID: Dawn Gordon MRN: [id number] DOB/AGE: 07/05/1920 84 y.o.  Admit date: 08/06/2017 Discharge date: 08/10/2017  Admission Diagnoses:  Postoperative wound dehiscence  Discharge Diagnoses:  Principal Problem:   Postoperative wound dehiscence   Past Medical History:  Diagnosis Date  . Alzheimers disease   . Amnesia   . Depression   . H/O fracture of left hip 07/03/2017  . Hyperlipidemia   . Overactive bladder   . Sleep apnea    no cpap used  . Vertigo     Surgeries: Procedure(s): IRRIGATION AND DEBRIDEMENT LEFT HIP on 08/06/2017   Consultants (if any):   Discharged Condition: Improved  Hospital Course: Dawn Gordon is an 84 y.o. female who was admitted 08/06/2017 with a diagnosis of Postoperative wound dehiscence and went to the operating room on 08/06/2017 and underwent the above named procedures.    She was given perioperative antibiotics:  Anti-infectives (From admission, onward)   Start     Dose/Rate Route Frequency Ordered Stop   08/10/17 0000  amoxicillin (AMOXIL) 500 MG capsule     500 mg Oral 3 times daily 08/10/17 1311 08/24/17 2359   08/08/17 0600  vancomycin (VANCOCIN) IVPB 750 mg/150 ml premix     750 mg 150 mL/hr over 60 Minutes Intravenous Every 36 hours 08/06/17 1838     08/07/17 1800  ceFAZolin (ANCEF) IVPB 1 g/50 mL premix     1 g 100 mL/hr over 30 Minutes Intravenous Every 12 hours 08/07/17 1416     08/07/17 0500  ceFAZolin (ANCEF) 500 mg in dextrose 5 % 100 mL IVPB  Status:  Discontinued     500 mg 210 mL/hr over 30 Minutes Intravenous Every 12 hours 08/06/17 1833 08/07/17 1416   08/06/17 1800  vancomycin (VANCOCIN) IVPB 750 mg/150 ml premix     750 mg 150 mL/hr over 60 Minutes Intravenous  Once 08/06/17 1708 08/06/17 1859   08/06/17 1339  ceFAZolin (ANCEF) 2-4 GM/100ML-% IVPB    Note to Pharmacy:  Anastasio Champion   : cabinet override      08/06/17 1339 08/07/17 0144    .  Intraoperative superficial wound culture  grew strep viridans.  Deep synovial fluid culture negative.  She was given IV vancomycin until the cultures finalized, and then she was transitioned to oral amoxicillin.  The patient removed her incisional wound VAC on the night of postop day #3.  Due to the excellent appearance of the incision, I elected to proceed with daily dressing changes with ABDs and tape.  She was given sequential compression devices, early ambulation, and Lovenox for DVT prophylaxis.  She benefited maximally from the hospital stay and there were no complications.    Recent vital signs:  Vitals:   08/09/17 2031 08/10/17 0600  BP: 134/77 126/72  Pulse: 78 77  Resp: 17 16  Temp: 98.4 F (36.9 C) 98.6 F (37 C)  SpO2: 97% 99%    Recent laboratory studies:  Lab Results  Component Value Date   HGB 10.8 (L) 08/09/2017   HGB 9.6 (L) 08/08/2017   HGB 10.0 (L) 08/07/2017   Lab Results  Component Value Date   WBC 6.7 08/09/2017   PLT 208 08/09/2017   Lab Results  Component Value Date   INR 1.51 07/02/2017   Lab Results  Component Value Date   NA 142 08/09/2017   K 3.7 08/09/2017   CL 104 08/09/2017   CO2 31 08/09/2017   BUN 15 08/09/2017  CREATININE 0.83 08/09/2017   GLUCOSE 85 08/09/2017    Discharge Medications:   Allergies as of 08/10/2017   No Known Allergies     Medication List    STOP taking these medications   enoxaparin 40 MG/0.4ML injection Commonly known as:  LOVENOX     TAKE these medications   acetaminophen 325 MG tablet Commonly known as:  TYLENOL Take 650 mg by mouth every 8 (eight) hours as needed for mild pain.   amoxicillin 500 MG capsule Commonly known as:  AMOXIL Take 1 capsule (500 mg total) by mouth 3 (three) times daily for 14 days.   bisacodyl 10 MG suppository Commonly known as:  DULCOLAX Place 10 mg rectally as needed for moderate constipation (If not relived by MOM).   buPROPion 75 MG tablet Commonly known as:  WELLBUTRIN Take 75 mg by mouth 2 (two)  times daily.   docusate sodium 100 MG capsule Commonly known as:  COLACE Take 1 capsule (100 mg total) by mouth 2 (two) times daily.   donepezil 10 MG tablet Commonly known as:  ARICEPT Take 10 mg by mouth at bedtime.   escitalopram 10 MG tablet Commonly known as:  LEXAPRO Take 15 mg by mouth daily.   magnesium hydroxide 400 MG/5ML suspension Commonly known as:  MILK OF MAGNESIA Take 30 mLs by mouth daily as needed for mild constipation.   meclizine 12.5 MG tablet Commonly known as:  ANTIVERT Take 12.5 mg by mouth 3 (three) times daily as needed for dizziness.   memantine 10 MG tablet Commonly known as:  NAMENDA Take 10 mg by mouth 2 (two) times daily.   polyvinyl alcohol 1.4 % ophthalmic solution Commonly known as:  LIQUIFILM TEARS Place 1 drop into both eyes 3 (three) times daily as needed for dry eyes.   PRESERVISION AREDS PO Take 1 capsule by mouth daily.   RA SALINE ENEMA 19-7 GM/118ML Enem Place 1 each rectally daily as needed (if not relieved by Bisacodyl).   simvastatin 40 MG tablet Commonly known as:  ZOCOR Take 40 mg by mouth daily.   traZODone 50 MG tablet Commonly known as:  DESYREL Take 50 mg by mouth at bedtime.   triamcinolone cream 0.1 % Commonly known as:  KENALOG Apply 1 application topically daily as needed (for itching).   vitamin B-12 500 MCG tablet Commonly known as:  CYANOCOBALAMIN Take 500 mcg by mouth daily.       Diagnostic Studies: Dg C-arm 1-60 Min-no Report  Result Date: 08/06/2017 Fluoroscopy was utilized by the requesting physician.  No radiographic interpretation.    Disposition: Discharge disposition: 01-Home or Self Care       Discharge Instructions    Call MD / Call 911   Complete by:  As directed    If you experience chest pain or shortness of breath, CALL 911 and be transported to the hospital emergency room.  If you develope a fever above 101 F, pus (white drainage) or increased drainage or redness at the  wound, or calf pain, call your surgeon's office.   Constipation Prevention   Complete by:  As directed    Drink plenty of fluids.  Prune juice may be helpful.  You may use a stool softener, such as Colace (over the counter) 100 mg twice a day.  Use MiraLax (over the counter) for constipation as needed.   Diet - low sodium heart healthy   Complete by:  As directed    Discharge instructions   Complete by:  As directed    Daily and PRN dressing change to left hip with ABDs and tape. Please ensure patient does not pick at her incision.   Increase activity slowly as tolerated   Complete by:  As directed        Contact information for follow-up providers    Kyrsten Deleeuw, Arlys John, MD. Schedule an appointment as soon as possible for a visit in 2 week(s).   Specialty:  Orthopedic Surgery Why:  suture removal Contact information: [address] [place] [address] [phone]            Contact information for after-discharge care    Destination    [facility] .   Service:  Skilled Nursing Contact information: [address] [place] [address] [phone]                   Signed: Jonette Pesa 08/10/2017, 1:16 PM

## 2017-08-10 NOTE — Progress Notes (Signed)
Subjective:  Patient reports pain as mild to moderate.  Denies N/V/CP/SOB. Per RN, patient pulled wound VAC off yesterday. Also pulled out IV.  Objective:   VITALS:   Vitals:   08/09/17 0633 08/09/17 1425 08/09/17 2031 08/10/17 0600  BP: (!) 168/86 134/74 134/77 126/72  Pulse: 72 83 78 77  Resp:  20 17 16   Temp:  98.1 F (36.7 C) 98.4 F (36.9 C) 98.6 F (37 C)  TempSrc:  Oral Oral Oral  SpO2: 96% 97% 97% 99%  Weight:      Height:        NAD, oriented to self only ABD soft Sensation intact distally Intact pulses distally Dorsiflexion/Plantar flexion intact Incision: dressing C/D/I and no drainage Compartment soft  incis c/d/i. No erythema or drainage.   Lab Results  Component Value Date   WBC 6.7 08/09/2017   HGB 10.8 (L) 08/09/2017   HCT 34.7 (L) 08/09/2017   MCV 98.0 08/09/2017   PLT 208 08/09/2017   BMET    Component Value Date/Time   NA 142 08/09/2017 0455   K 3.7 08/09/2017 0455   CL 104 08/09/2017 0455   CO2 31 08/09/2017 0455   GLUCOSE 85 08/09/2017 0455   BUN 15 08/09/2017 0455   CREATININE 0.83 08/09/2017 0455   CALCIUM 8.3 (L) 08/09/2017 0455   GFRNONAA 59 (L) 08/09/2017 0455   GFRAA >60 08/09/2017 0455    Recent Results (from the past 240 hour(s))  Aerobic/Anaerobic Culture (surgical/deep wound)     Status: None (Preliminary result)   Collection Time: 08/06/17  2:32 PM  Result Value Ref Range Status   Specimen Description   Final    FLUID LEFT HIP Performed at [facility], [address] [place], [place] [address]    Special Requests   Final    NONE Performed at [facility], [address] [place], [place] [address]    Gram Stain   Final    RARE WBC PRESENT,BOTH PMN AND MONONUCLEAR NO ORGANISMS SEEN    Culture   Final    RARE VIRIDANS STREPTOCOCCUS SUSCEPTIBILITIES TO FOLLOW HOLDING FOR POSSIBLE ANAEROBE Performed at [facility], [address] [place], [place] [address]    Report Status  PENDING  Incomplete  Aerobic/Anaerobic Culture (surgical/deep wound)     Status: None (Preliminary result)   Collection Time: 08/06/17  2:47 PM  Result Value Ref Range Status   Specimen Description   Final    SYNOVIAL LEFT HIP Performed at [facility], [address] [place], [place] [address]    Special Requests   Final    NONE Performed at [facility], [address] [place], [place] [address]    Gram Stain   Final    FEW WBC PRESENT,BOTH PMN AND MONONUCLEAR NO ORGANISMS SEEN    Culture   Final    NO GROWTH 3 DAYS NO ANAEROBES ISOLATED; CULTURE IN PROGRESS FOR 5 DAYS Performed at [facility], [address] [place], [place] [address]    Report Status PENDING  Incomplete     Assessment/Plan: 4 Days Post-Op   Active Problems:   Postoperative wound dehiscence   WBAT with walker DVT ppx: Lovenox, SCDs, TEDS Cont IV vanco, ancef PO pain control PT/OT Daily dry dressing changes with ABDs and tape Dispo: follow intraop cultures, d/c to SNF once C+S back   Jonette PesaBrian J Catherina Pates 08/10/2017, 6:55 AM   Samson FredericBrian Katara Griner, MD  Cell [phone]

## 2017-08-10 NOTE — Clinical Social Work Placement (Signed)
Patient returning to [facility] to complete ST rehab. Facility aware of patient's discharge and confirmed bed offer. [facility] contacted, patient's son called (no answer, left voicemail). Patient's RN can call report to [phone], packet complete. CSW signing off, no other needs identified at this time.  CLINICAL SOCIAL WORK PLACEMENT  NOTE  Date:  08/10/2017  Patient Details  Name: Dawn Gordon MRN: [id number] Date of Birth: 1920-07-09  Clinical Social Work is seeking post-discharge placement for this patient at the Skilled  Nursing Facility level of care (*CSW will initial, date and re-position this form in  chart as items are completed):  Yes   Patient/family provided with [facility] Clinical Social Work Department's list of facilities offering this level of care within the geographic area requested by the patient (or if unable, by the patient's family).  Yes   Patient/family informed of their freedom to choose among providers that offer the needed level of care, that participate in Medicare, Medicaid or managed care program needed by the patient, have an available bed and are willing to accept the patient.  Yes   Patient/family informed of [facility]'s ownership interest in [facility] and [facility], as well as of the fact that they are under no obligation to receive care at these facilities.  PASRR submitted to EDS on       PASRR number received on       Existing PASRR number confirmed on 08/10/17     FL2 transmitted to all facilities in geographic area requested by pt/family on 08/10/17     FL2 transmitted to all facilities within larger geographic area on       Patient informed that his/her managed care company has contracts with or will negotiate with certain facilities, including the following:        Yes   Patient/family informed of bed offers received.  Patient chooses bed at [facility]     Physician recommends and patient chooses bed at       Patient to be transferred to [facility] on 08/10/17.  Patient to be transferred to facility by [facility]     Patient family notified on 08/10/17 of transfer.  Name of family member notified:  Dawn Gordon (CSW left voicemail)     PHYSICIAN       Additional Comment:    _______________________________________________ Antionette PolesKimberly L Cassondra Stachowski, LCSW 08/10/2017, 3:57 PM

## 2017-08-11 ENCOUNTER — Encounter: Payer: Self-pay | Admitting: Internal Medicine

## 2017-08-11 LAB — AEROBIC/ANAEROBIC CULTURE W GRAM STAIN (SURGICAL/DEEP WOUND): Culture: NO GROWTH

## 2017-08-12 LAB — AEROBIC/ANAEROBIC CULTURE (SURGICAL/DEEP WOUND)

## 2017-08-12 LAB — AEROBIC/ANAEROBIC CULTURE W GRAM STAIN (SURGICAL/DEEP WOUND)

## 2017-08-16 ENCOUNTER — Encounter: Payer: Self-pay | Admitting: Internal Medicine

## 2017-08-16 DIAGNOSIS — G4700 Insomnia, unspecified: Secondary | ICD-10-CM | POA: Insufficient documentation

## 2017-08-20 ENCOUNTER — Encounter: Payer: Self-pay | Admitting: Internal Medicine

## 2017-08-20 ENCOUNTER — Non-Acute Institutional Stay (SKILLED_NURSING_FACILITY): Payer: Medicare Other | Admitting: Internal Medicine

## 2017-08-20 DIAGNOSIS — F5101 Primary insomnia: Secondary | ICD-10-CM | POA: Diagnosis not present

## 2017-08-20 DIAGNOSIS — E785 Hyperlipidemia, unspecified: Secondary | ICD-10-CM

## 2017-08-20 DIAGNOSIS — F32A Depression, unspecified: Secondary | ICD-10-CM

## 2017-08-20 DIAGNOSIS — F329 Major depressive disorder, single episode, unspecified: Secondary | ICD-10-CM

## 2017-08-20 DIAGNOSIS — G301 Alzheimer's disease with late onset: Secondary | ICD-10-CM | POA: Diagnosis not present

## 2017-08-20 DIAGNOSIS — F0280 Dementia in other diseases classified elsewhere without behavioral disturbance: Secondary | ICD-10-CM | POA: Diagnosis not present

## 2017-08-20 DIAGNOSIS — T8131XD Disruption of external operation (surgical) wound, not elsewhere classified, subsequent encounter: Secondary | ICD-10-CM

## 2017-08-20 NOTE — Progress Notes (Signed)
Location:  [facility] Room Number: 108 Place of Service:  SNF [phone])  Provider: Randon Goldsmith. Lyn Hollingshead, MD  PCP: System, Pcp Not In Patient Care Team: System, Pcp Not In as PCP - General  Extended Emergency Contact Information Primary Emergency Contact: Rohe,TIM Address: [address] [place]          [place], [place] [address] [place] Home Phone: [phone] Relation: Son  No Known Allergies  Chief Complaint  Patient presents with  . Discharge Note    HPI:   84 y.o. female with Alzheimer's disease, depression, hyperlipidemia, overactive bladder, who was admitted to [facility] from 6/6-10 where she was admitted with a diagnosis of postoperative wound dehiscence.  She went to the operating room on 6/6 and underwent an irrigation and debridement of her left hip.  Intraoperative superficial wound cultures grew out strep viridans, deep synovial fluid culture was negative.  She was given IV vancomycin then transition to oral amoxicillin and the patient's incisional wound VAC was removed on postop day 3.  Patient was considered stable to be admitted to skilled nursing facility which she did for OT/PT and he is now ready to be discharged to assisted living facility.    Past Medical History:  Diagnosis Date  . Alzheimers disease   . Amnesia   . Depression   . H/O fracture of left hip 07/03/2017  . Hyperlipidemia   . Overactive bladder   . Sleep apnea    no cpap used  . Vertigo     Past Surgical History:  Procedure Laterality Date  . ANKLE SURGERY  yrs ago  . ANTERIOR APPROACH HEMI HIP ARTHROPLASTY Left 07/03/2017   Procedure: ANTERIOR APPROACH HEMI HIP ARTHROPLASTY;  Surgeon: Samson Frederic, MD;  Location: [facility];  Service: Orthopedics;  Laterality: Left;  . BACK SURGERY     yrs ago  . INCISION AND DRAINAGE HIP Left 08/06/2017   Procedure: IRRIGATION AND DEBRIDEMENT LEFT HIP;  Surgeon: Samson Frederic, MD;  Location: [facility];   Service: Orthopedics;  Laterality: Left;  . KNEE SURGERY     replacement not sure which sure     reports that she has never smoked. She has never used smokeless tobacco. She reports that she does not drink alcohol or use drugs. Social History   Socioeconomic History  . Marital status: Widowed    Spouse name: Not on file  . Number of children: Not on file  . Years of education: Not on file  . Highest education level: Not on file  Occupational History  . Not on file  Social Needs  . Financial resource strain: Not on file  . Food insecurity:    Worry: Not on file    Inability: Not on file  . Transportation needs:    Medical: Not on file    Non-medical: Not on file  Tobacco Use  . Smoking status: Never Smoker  . Smokeless tobacco: Never Used  Substance and Sexual Activity  . Alcohol use: No  . Drug use: Never  . Sexual activity: Not on file  Lifestyle  . Physical activity:    Days per week: Not on file    Minutes per session: Not on file  . Stress: Not on file  Relationships  . Social connections:    Talks on phone: Not on file    Gets together: Not on file    Attends religious service: Not on file    Active member  of club or organization: Not on file    Attends meetings of clubs or organizations: Not on file    Relationship status: Not on file  . Intimate partner violence:    Fear of current or ex partner: Not on file    Emotionally abused: Not on file    Physically abused: Not on file    Forced sexual activity: Not on file  Other Topics Concern  . Not on file  Social History Narrative  . Not on file    Pertinent  Health Maintenance Due  Topic Date Due  . DEXA SCAN  08/06/2018 (Originally 11/16/1987)  . INFLUENZA VACCINE  10/01/2017  . PNA vac Low Risk Adult  Completed    Medications: Allergies as of 08/20/2017   No Known Allergies     Medication List        Accurate as of 08/20/17 11:59 PM. Always use your most recent med list.            acetaminophen 325 MG tablet Commonly known as:  TYLENOL Take 650 mg by mouth every 8 (eight) hours as needed for mild pain.   amoxicillin 500 MG capsule Commonly known as:  AMOXIL Take 1 capsule (500 mg total) by mouth 3 (three) times daily for 14 days.   bisacodyl 10 MG suppository Commonly known as:  DULCOLAX Place 10 mg rectally as needed for moderate constipation (If not relived by MOM).   buPROPion 75 MG tablet Commonly known as:  WELLBUTRIN Take 75 mg by mouth 2 (two) times daily.   docusate sodium 100 MG capsule Commonly known as:  COLACE Take 1 capsule (100 mg total) by mouth 2 (two) times daily.   donepezil 10 MG tablet Commonly known as:  ARICEPT Take 10 mg by mouth at bedtime.   escitalopram 10 MG tablet Commonly known as:  LEXAPRO Take 15 mg by mouth daily.   magnesium hydroxide 400 MG/5ML suspension Commonly known as:  MILK OF MAGNESIA Take 30 mLs by mouth daily as needed for mild constipation.   meclizine 12.5 MG tablet Commonly known as:  ANTIVERT Take 12.5 mg by mouth 3 (three) times daily as needed for dizziness.   memantine 10 MG tablet Commonly known as:  NAMENDA Take 10 mg by mouth 2 (two) times daily.   polyvinyl alcohol 1.4 % ophthalmic solution Commonly known as:  LIQUIFILM TEARS Place 1 drop into both eyes 3 (three) times daily as needed for dry eyes.   PRESERVISION AREDS PO Take 1 capsule by mouth daily.   RA SALINE ENEMA 19-7 GM/118ML Enem Place 1 each rectally daily as needed (if not relieved by Bisacodyl).   simvastatin 40 MG tablet Commonly known as:  ZOCOR Take 40 mg by mouth daily.   traZODone 50 MG tablet Commonly known as:  DESYREL Take 50 mg by mouth at bedtime.   triamcinolone cream 0.1 % Commonly known as:  KENALOG Apply 1 application topically daily as needed (for itching).   vitamin B-12 500 MCG tablet Commonly known as:  CYANOCOBALAMIN Take 500 mcg by mouth daily.        Vitals:   08/20/17 1049  BP:  124/72  Pulse: 82  Resp: 20  Temp: 97.6 F (36.4 C)  SpO2: 96%  Weight: 133 lb 6.4 oz (60.5 kg)  Height: 5' (1.524 m)   Body mass index is 26.05 kg/m.  Physical Exam  GENERAL APPEARANCE: Alert, conversant. No acute distress.  HEENT: Unremarkable. RESPIRATORY: Breathing is even, unlabored. Lung sounds  are clear   CARDIOVASCULAR: Heart RRR no murmurs, rubs or gallops. No peripheral edema.  GASTROINTESTINAL: Abdomen is soft, non-tender, not distended w/ normal bowel sounds.  NEUROLOGIC: Cranial nerves 2-12 grossly intact. Moves all extremities   Labs reviewed: Basic Metabolic Panel: Recent Labs    08/07/17 0511 08/08/17 0543 08/09/17 0455  NA 141 145 142  K 4.2 4.4 3.7  CL 106 109 104  CO2 28 30 31   GLUCOSE 90 87 85  BUN 15 16 15   CREATININE 0.83 0.79 0.83  CALCIUM 8.2* 8.1* 8.3*   No results found for: [facility] Function Tests: Recent Labs    07/02/17 2325  AST 16  ALT 11*  ALKPHOS 136*  BILITOT 1.0  PROT 6.8  ALBUMIN 3.5   No results for input(s): LIPASE, AMYLASE in the last 8760 hours. No results for input(s): AMMONIA in the last 8760 hours. CBC: Recent Labs    07/05/17 0513 07/06/17 0447 07/07/17 0450  08/07/17 0511 08/08/17 0543 08/09/17 0455  WBC 9.8 10.0 8.9   < > 6.9 6.6 6.7  NEUTROABS 6.9 7.2 6.5  --   --   --   --   HGB 10.2* 10.2* 9.9*   < > 10.0* 9.6* 10.8*  HCT 31.7* 31.9* 31.2*   < > 32.3* 31.8* 34.7*  MCV 94.6 94.9 94.0   < > 97.9 99.7 98.0  PLT 219 211 201   < > 194 175 208   < > = values in this interval not displayed.   Lipid No results for input(s): CHOL, HDL, LDLCALC, TRIG in the last 8760 hours. Cardiac Enzymes: No results for input(s): CKTOTAL, CKMB, CKMBINDEX, TROPONINI in the last 8760 hours. BNP: No results for input(s): BNP in the last 8760 hours. CBG: Recent Labs    08/06/17 2252  GLUCAP 188*    Procedures and Imaging Studies During Stay: Dg C-arm 1-60 Min-no Report  Result Date: 08/06/2017 Fluoroscopy  was utilized by the requesting physician.  No radiographic interpretation.    Assessment/Plan:   Postoperative wound dehiscence, subsequent encounter  Depression, unspecified depression type  Late onset Alzheimer's disease without behavioral disturbance  Hyperlipidemia, unspecified hyperlipidemia type  Primary insomnia   Patient is being discharged with the following home health services: OT/PT  Patient is being discharged with the following durable medical equipment: None  Patient has been advised to f/u with their PCP in 1-2 weeks to bring them up to date on their rehab stay.  Social services at facility was responsible for arranging this appointment.  Pt was provided with a 30 day supply of prescriptions for medications and refills must be obtained from their PCP.  For controlled substances, a more limited supply may be provided adequate until PCP appointment only.  Medications have been reconciled.  Time spent greater than 30 minutes;> 50% of time with patient was spent reviewing records, labs, tests and studies, counseling and developing plan of care  Randon Goldsmithnne D. Lyn HollingsheadAlexander, MD

## 2017-08-22 ENCOUNTER — Encounter: Payer: Self-pay | Admitting: Internal Medicine

## 2017-08-23 ENCOUNTER — Encounter: Payer: Self-pay | Admitting: Internal Medicine

## 2017-08-30 ENCOUNTER — Encounter: Payer: Self-pay | Admitting: Internal Medicine

## 2017-08-30 NOTE — Assessment & Plan Note (Signed)
Stable; tinea Aricept 10 mg daily and Namenda 10 mg twice daily

## 2017-08-30 NOTE — Assessment & Plan Note (Signed)
Stable; continue Wellbutrin 75 mg twice daily, Lexapro 15 mg daily and trazodone 50 mg nightly

## 2017-08-30 NOTE — Assessment & Plan Note (Signed)
No known recent episodes; continue Antivert 12.5 mg 3 times daily as needed should episodes occur

## 2018-11-29 ENCOUNTER — Other Ambulatory Visit: Payer: Self-pay

## 2018-11-29 ENCOUNTER — Emergency Department (HOSPITAL_BASED_OUTPATIENT_CLINIC_OR_DEPARTMENT_OTHER)
Admission: EM | Admit: 2018-11-29 | Discharge: 2018-11-29 | Disposition: A | Discharge status: Home / Self Care | Payer: Medicare Other | Attending: Emergency Medicine | Admitting: Emergency Medicine

## 2018-11-29 ENCOUNTER — Encounter (HOSPITAL_BASED_OUTPATIENT_CLINIC_OR_DEPARTMENT_OTHER): Payer: Self-pay | Admitting: Emergency Medicine

## 2018-11-29 ENCOUNTER — Emergency Department (HOSPITAL_BASED_OUTPATIENT_CLINIC_OR_DEPARTMENT_OTHER): Payer: Medicare Other

## 2018-11-29 DIAGNOSIS — X58XXXA Exposure to other specified factors, initial encounter: Secondary | ICD-10-CM | POA: Diagnosis not present

## 2018-11-29 DIAGNOSIS — G309 Alzheimer's disease, unspecified: Secondary | ICD-10-CM | POA: Diagnosis not present

## 2018-11-29 DIAGNOSIS — Z20828 Contact with and (suspected) exposure to other viral communicable diseases: Secondary | ICD-10-CM | POA: Insufficient documentation

## 2018-11-29 DIAGNOSIS — Y999 Unspecified external cause status: Secondary | ICD-10-CM | POA: Insufficient documentation

## 2018-11-29 DIAGNOSIS — S60454A Superficial foreign body of right ring finger, initial encounter: Secondary | ICD-10-CM | POA: Diagnosis present

## 2018-11-29 DIAGNOSIS — Y939 Activity, unspecified: Secondary | ICD-10-CM | POA: Insufficient documentation

## 2018-11-29 DIAGNOSIS — Y929 Unspecified place or not applicable: Secondary | ICD-10-CM | POA: Diagnosis not present

## 2018-11-29 DIAGNOSIS — F0280 Dementia in other diseases classified elsewhere without behavioral disturbance: Secondary | ICD-10-CM | POA: Insufficient documentation

## 2018-11-29 DIAGNOSIS — Z79899 Other long term (current) drug therapy: Secondary | ICD-10-CM | POA: Diagnosis not present

## 2018-11-29 DIAGNOSIS — N390 Urinary tract infection, site not specified: Secondary | ICD-10-CM | POA: Insufficient documentation

## 2018-11-29 LAB — CBC WITH DIFFERENTIAL/PLATELET
Abs Immature Granulocytes: 0.07 10*3/uL (ref 0.00–0.07)
Basophils Absolute: 0 10*3/uL (ref 0.0–0.1)
Basophils Relative: 0 %
Eosinophils Absolute: 0.1 10*3/uL (ref 0.0–0.5)
Eosinophils Relative: 1 %
HCT: 42.3 % (ref 36.0–46.0)
Hemoglobin: 12.9 g/dL (ref 12.0–15.0)
Immature Granulocytes: 1 %
Lymphocytes Relative: 12 %
Lymphs Abs: 1.2 10*3/uL (ref 0.7–4.0)
MCH: 30.3 pg (ref 26.0–34.0)
MCHC: 30.5 g/dL (ref 30.0–36.0)
MCV: 99.3 fL (ref 80.0–100.0)
Monocytes Absolute: 0.9 10*3/uL (ref 0.1–1.0)
Monocytes Relative: 9 %
Neutro Abs: 7.6 10*3/uL (ref 1.7–7.7)
Neutrophils Relative %: 77 %
Platelets: 201 10*3/uL (ref 150–400)
RBC: 4.26 MIL/uL (ref 3.87–5.11)
RDW: 13.6 % (ref 11.5–15.5)
WBC: 9.8 10*3/uL (ref 4.0–10.5)
nRBC: 0 % (ref 0.0–0.2)

## 2018-11-29 LAB — COMPREHENSIVE METABOLIC PANEL
ALT: 12 U/L (ref 0–44)
AST: 18 U/L (ref 15–41)
Albumin: 3.3 g/dL — ABNORMAL LOW (ref 3.5–5.0)
Alkaline Phosphatase: 96 U/L (ref 38–126)
Anion gap: 9 (ref 5–15)
BUN: 18 mg/dL (ref 8–23)
CO2: 26 mmol/L (ref 22–32)
Calcium: 8.9 mg/dL (ref 8.9–10.3)
Chloride: 109 mmol/L (ref 98–111)
Creatinine, Ser: 0.75 mg/dL (ref 0.44–1.00)
GFR calc Af Amer: >60 mL/min (ref >60)
GFR calc non Af Amer: >60 mL/min (ref >60)
Glucose, Bld: 126 mg/dL — ABNORMAL HIGH (ref 70–99)
Potassium: 3.6 mmol/L (ref 3.5–5.1)
Sodium: 144 mmol/L (ref 135–145)
Total Bilirubin: 0.4 mg/dL (ref 0.3–1.2)
Total Protein: 6.8 g/dL (ref 6.5–8.1)

## 2018-11-29 LAB — URINALYSIS, ROUTINE W REFLEX MICROSCOPIC
Bilirubin Urine: NEGATIVE
Glucose, UA: NEGATIVE mg/dL
Ketones, ur: 15 mg/dL — AB
Nitrite: POSITIVE — AB
Protein, ur: 30 mg/dL — AB
Specific Gravity, Urine: 1.025 (ref 1.005–1.030)
pH: 6.5 (ref 5.0–8.0)

## 2018-11-29 LAB — URINALYSIS, MICROSCOPIC (REFLEX)
RBC / HPF: >50 RBC/hpf (ref 0–5)
WBC, UA: >50 WBC/hpf (ref 0–5)

## 2018-11-29 LAB — SARS CORONAVIRUS 2 BY RT PCR (HOSPITAL ORDER, PERFORMED IN ~~LOC~~ HOSPITAL LAB): SARS Coronavirus 2: NEGATIVE

## 2018-11-29 MED ORDER — ACETAMINOPHEN 325 MG PO TABS
650.0000 mg | ORAL_TABLET | Freq: Once | ORAL | Status: AC
  Administered 2018-11-29: 17:00:00 650 mg via ORAL
  Filled 2018-11-29: qty 2

## 2018-11-29 MED ORDER — CEPHALEXIN 250 MG PO CAPS
500.0000 mg | ORAL_CAPSULE | Freq: Once | ORAL | Status: AC
  Administered 2018-11-29: 500 mg via ORAL
  Filled 2018-11-29: qty 2

## 2018-11-29 MED ORDER — CEPHALEXIN 500 MG PO CAPS: 500.0000 mg | ORAL_CAPSULE | Freq: Two times a day (BID) | ORAL | 0 refills | Status: AC

## 2018-11-29 NOTE — ED Triage Notes (Signed)
Pt with elevated temp and states " doesn't feel good"

## 2018-11-29 NOTE — ED Notes (Signed)
Called [facility] - requested transport for patient

## 2018-11-29 NOTE — ED Provider Notes (Signed)
[facility] EMERGENCY DEPARTMENT Provider Note   CSN: [id number] Arrival date & time: 11/29/18  1623     History   Chief Complaint Chief Complaint  Patient presents with  . Hand Pain    HPI Dawn Gordon is a 85 y.o. female past medical history of Alzheimer's dementia, hyperlipidemia, [facility] with chief complaint of a ring stuck on her right ring finger.  Patient states she had a shoulder injury, she is currently in a sling and was having swelling to the hand causing the rain to be stuck.  The ring was removed by RN staff prior to my evaluation.  There is swelling present to the right hand though the ring finger appears well perfused without skin breakdown. Initial vital signs reveal low-grade fever of 100.4 F.  Other than patient endorsing not feeling well recently, she cannot provide any other history to indicate any illness.     The history is provided by the patient. The history is limited by the condition of the patient.    Past Medical History:  Diagnosis Date  . Alzheimers disease (HCC)   . Amnesia   . Depression   . H/O fracture of left hip 07/03/2017  . Hyperlipidemia   . Overactive bladder   . Sleep apnea    no cpap used  . Vertigo     Patient Active Problem List   Diagnosis Date Noted  . Insomnia 08/16/2017  . Postoperative wound dehiscence 08/06/2017  . H/O total hip arthroplasty, left 07/11/2017  . Acute blood loss as cause of postoperative anemia 07/11/2017  . Hyperlipidemia 07/11/2017  . Depression 07/11/2017  . Vertigo 07/11/2017  . Closed left hip fracture, initial encounter (HCC) 07/03/2017  . Alzheimer's dementia without behavioral disturbance (HCC) 07/03/2017  . Displaced fracture of left femoral neck (HCC) 07/03/2017    Past Surgical History:  Procedure Laterality Date  . ANKLE SURGERY  yrs ago  . ANTERIOR APPROACH HEMI HIP ARTHROPLASTY Left 07/03/2017   Procedure: ANTERIOR  APPROACH HEMI HIP ARTHROPLASTY;  Surgeon: Samson FredericSwinteck, Brian, MD;  Location: [facility];  Service: Orthopedics;  Laterality: Left;  . BACK SURGERY     yrs ago  . INCISION AND DRAINAGE HIP Left 08/06/2017   Procedure: IRRIGATION AND DEBRIDEMENT LEFT HIP;  Surgeon: Samson FredericSwinteck, Brian, MD;  Location: [facility];  Service: Orthopedics;  Laterality: Left;  . KNEE SURGERY     replacement not sure which sure     OB History   No obstetric history on file.      Home Medications    Prior to Admission medications   Medication Sig Start Date End Date Taking? Authorizing Provider  acetaminophen (TYLENOL) 325 MG tablet Take 650 mg by mouth every 8 (eight) hours as needed for mild pain.    [provider]  bisacodyl (DULCOLAX) 10 MG suppository Place 10 mg rectally as needed for moderate constipation (If not relived by MOM).    [provider]  buPROPion (WELLBUTRIN) 75 MG tablet Take 75 mg by mouth 2 (two) times daily. 06/25/16   [provider]  cephALEXin (KEFLEX) 500 MG capsule Take 1 capsule (500 mg total) by mouth 2 (two) times daily for 7 days. 11/29/18 12/06/18  Robinson, [place] N, PA-C  docusate sodium (COLACE) 100 MG capsule Take 1 capsule (100 mg total) by mouth 2 (two) times daily. 07/07/17   Briant CedarEzenduka, Nkeiruka J, MD  donepezil (ARICEPT) 10 MG tablet Take 10 mg by  mouth at bedtime.    [provider]  escitalopram (LEXAPRO) 10 MG tablet Take 15 mg by mouth daily.     [provider]  magnesium hydroxide (MILK OF MAGNESIA) 400 MG/5ML suspension Take 30 mLs by mouth daily as needed for mild constipation.    [provider]  meclizine (ANTIVERT) 12.5 MG tablet Take 12.5 mg by mouth 3 (three) times daily as needed for dizziness.    [provider]  memantine (NAMENDA) 10 MG tablet Take 10 mg by mouth 2 (two) times daily.    [provider]  Multiple Vitamins-Minerals (PRESERVISION AREDS PO) Take 1 capsule by mouth daily.     [provider]  polyvinyl alcohol (LIQUIFILM TEARS) 1.4 % ophthalmic solution Place 1 drop into both eyes 3 (three) times daily as needed for dry eyes.    [provider]  simvastatin (ZOCOR) 40 MG tablet Take 40 mg by mouth daily.    [provider]  Sodium Phosphates (RA SALINE ENEMA) 19-7 GM/118ML ENEM Place 1 each rectally daily as needed (if not relieved by Bisacodyl).    [provider]  traZODone (DESYREL) 50 MG tablet Take 50 mg by mouth at bedtime.    [provider]  triamcinolone cream (KENALOG) 0.1 % Apply 1 application topically daily as needed (for itching).     [provider]  vitamin B-12 (CYANOCOBALAMIN) 500 MCG tablet Take 500 mcg by mouth daily.     [provider]    Family History Family History  Problem Relation Age of Onset  . Cancer Father   . Macular degeneration Paternal Grandmother     Social History Social History   Tobacco Use  . Smoking status: Never Smoker  . Smokeless tobacco: Never Used  Substance Use Topics  . Alcohol use: No  . Drug use: Never     Allergies   Patient has no known allergies.   Review of Systems Review of Systems  Unable to perform ROS: Dementia     Physical Exam Updated Vital Signs BP (!) 125/93 (BP Location: Left Arm)   Pulse 88   Temp (!) 100.4 F (38 C) (Oral)   Resp 14   SpO2 95%   Physical Exam Vitals signs and nursing note reviewed.  Constitutional:      General: She is not in acute distress.    Appearance: She is well-developed. She is not ill-appearing.  HENT:     Head: Normocephalic and atraumatic.  Eyes:     Conjunctiva/sclera: Conjunctivae normal.  Cardiovascular:     Rate and Rhythm: Normal rate and regular rhythm.  Pulmonary:     Effort: Pulmonary effort is normal. No respiratory distress.     Breath sounds: Normal breath sounds.  Abdominal:     General: Bowel sounds are normal.     Palpations: Abdomen is soft.     Tenderness: There is  no abdominal tenderness. There is no guarding or rebound.  Skin:    General: Skin is warm.  Neurological:     Mental Status: She is alert.  Psychiatric:        Behavior: Behavior normal.      ED Treatments / Results  Labs (all labs ordered are listed, but only abnormal results are displayed) Labs Reviewed  URINALYSIS, ROUTINE W REFLEX MICROSCOPIC - Abnormal; Notable for the following components:      Result Value   APPearance CLOUDY (*)    Hgb urine dipstick LARGE (*)  Ketones, ur 15 (*)    Protein, ur 30 (*)    Nitrite POSITIVE (*)    Leukocytes,Ua MODERATE (*)    All other components within normal limits  COMPREHENSIVE METABOLIC PANEL - Abnormal; Notable for the following components:   Glucose, Bld 126 (*)    Albumin 3.3 (*)    All other components within normal limits  URINALYSIS, MICROSCOPIC (REFLEX) - Abnormal; Notable for the following components:   Bacteria, UA MANY (*)    All other components within normal limits  SARS CORONAVIRUS 2 (HOSPITAL ORDER, PERFORMED IN [facility])  URINE CULTURE  CBC WITH DIFFERENTIAL/PLATELET    EKG None  Radiology Dg Chest Port 1 View  Result Date: 11/29/2018 CLINICAL DATA:  Recent humerus fracture.  Fever. EXAM: PORTABLE CHEST 1 VIEW COMPARISON:  Chest radiograph 07/02/2017 FINDINGS: Stable cardiac and mediastinal contours. Tortuosity and calcification of the thoracic aorta. No large area pulmonary consolidation. No pleural effusion or pneumothorax. Incompletely visualized right humerus fracture. IMPRESSION: No acute cardiopulmonary process. Incompletely visualized right humerus fracture. Electronically Signed   By: Annia Belt M.D.   On: 11/29/2018 18:08    Procedures Procedures (including critical care time)  Medications Ordered in ED Medications  cephALEXin (KEFLEX) capsule 500 mg (has no administration in time range)  acetaminophen (TYLENOL) tablet 650 mg (650 mg Oral Given 11/29/18 1719)     Initial  Impression / Assessment and Plan / ED Course  I have reviewed the triage vital signs and the nursing notes.  Pertinent labs & imaging results that were available during my care of the patient were reviewed by me and considered in my medical decision making (see chart for details).  Clinical Course as of Nov 28 1912  Mon Nov 29, 2018  1757 Spoke with Laurelyn Sickle at [facility] memory care living facility, who cares for patient.  She states she checked patient's temperature just prior to EMS arrival and it was 97.3 F.  She has been at her mental baseline without any signs or symptoms of illness that she is aware of.  discussed UA results and plan for discharge with antibiotics, pending COVID test.   [JR]    Clinical Course User Index [JR] Robinson, [place] N, PA-C       Patient presenting from skilled nursing facility for ring removal.  Patient is status post right humerus fracture and had swelling to her right hand which made it difficult to remove her ring.  Her ring was successfully removed in triage by RN prior to my evaluation.  Patient's finger appears well perfused on evaluation.  There is generalized swelling to the right distal arm and hand, likely secondary to patient's recent fracture.  In triage she was noted to have a low-grade fever of 100.4 F.  She is otherwise well-appearing and in no distress.  Discussed with patient's caregiver at her living facility who reports no changes in patient's mental status she is at baseline today.  She was afebrile upon leaving the facility to come to the ED and has been having no infectious symptoms.  Labs and chest x-ray done today which reveal urinary tract infection.  Negative COVID.  Negative chest x-ray.  Labs are very reassuring without leukocytosis or significant electrolyte derangement.  Believe she is safe for outpatient treatment with p.o. antibiotics.  She will be discharged with Keflex and instructions to follow-up with PCP.  Patient  discussed with evaluated Dr. Pilar Plate, agrees with care plan and discharge.  Final Clinical Impressions(s) /  ED Diagnoses   Final diagnoses:  Superficial foreign body of right ring finger, initial encounter  Acute UTI    ED Discharge Orders         Ordered    cephALEXin (KEFLEX) 500 MG capsule  2 times daily     11/29/18 1848           Robinson, [place] N, PA-C 11/29/18 1917    Sabas Sous, MD 11/30/18 [phone]

## 2018-11-29 NOTE — Discharge Instructions (Addendum)
Dawn Gordon COVID test is negative.  Her urine is showing signs of infection.  Her lab work is otherwise reassuring. You can treat fever with Tylenol. She has been given her evening dose of antibiotic for UTI.  Begin the prescribed antibiotics tomorrow morning, and be sure she takes them until gone. Follow-up closely with her primary care provider Return the emergency department if she develops worsening high fever, vomiting, or new or concerning symptoms.

## 2018-11-29 NOTE — ED Triage Notes (Signed)
Ring on right  Ring finger stuck. PT broke arm a week ago and having arm swelling. EMS transport from [facility]. Ring removed by RN staff here.

## 2018-11-29 NOTE — ED Notes (Signed)
Report given to [facility]. States no RN on the floor. Awaiting [facility] for transport. Ring in a cup in pt's robe pocket. [employer] informed of this and the patient.

## 2018-12-02 LAB — URINE CULTURE: Culture: >=100000 — AB

## 2018-12-03 ENCOUNTER — Telehealth: Payer: Self-pay | Admitting: Emergency Medicine

## 2018-12-03 NOTE — Telephone Encounter (Signed)
Post ED Visit - Positive Culture Follow-up  Culture report reviewed by antimicrobial stewardship pharmacist: [place] Team []  Elenor Quinones, Pharm.D. [x]  Heide Guile, Pharm.D., BCPS AQ-ID []  Parks Neptune, Pharm.D., BCPS []  Alycia Rossetti, Pharm.D., BCPS []  [place], [place].D., BCPS, AAHIVP []  Legrand Como, Pharm.D., BCPS, AAHIVP []  Salome Arnt, PharmD, BCPS []  Johnnette Gourd, PharmD, BCPS []  Hughes Better, PharmD, BCPS []  Leeroy Cha, PharmD []  Laqueta Linden, PharmD, BCPS []  Albertina Parr, PharmD  [place] Team []  Leodis Sias, PharmD []  Lindell Spar, PharmD []  Royetta Asal, PharmD []  Graylin Shiver, Rph []  Rema Fendt) Glennon Mac, PharmD []  Arlyn Dunning, PharmD []  Netta Cedars, PharmD []  Dia Sitter, PharmD []  Leone Haven, PharmD []  Gretta Arab, PharmD []  Theodis Shove, PharmD []  Peggyann Juba, PharmD []  Reuel Boom, PharmD   Positive urine culture Treated with Cephalexin, organism sensitive to the same and no further patient follow-up is required at this time.  Sandi Raveling Jane Broughton 12/03/2018, 9:53 AM

## 2019-03-04 DEATH — deceased

## 2019-05-06 IMAGING — DX DG CHEST 1V
1 series · 1 of 1 positions shown · non-contrast
Comparison: 06/13/2016

CLINICAL DATA: Hip fracture

EXAM:
CHEST  1 VIEW

[chest ap]
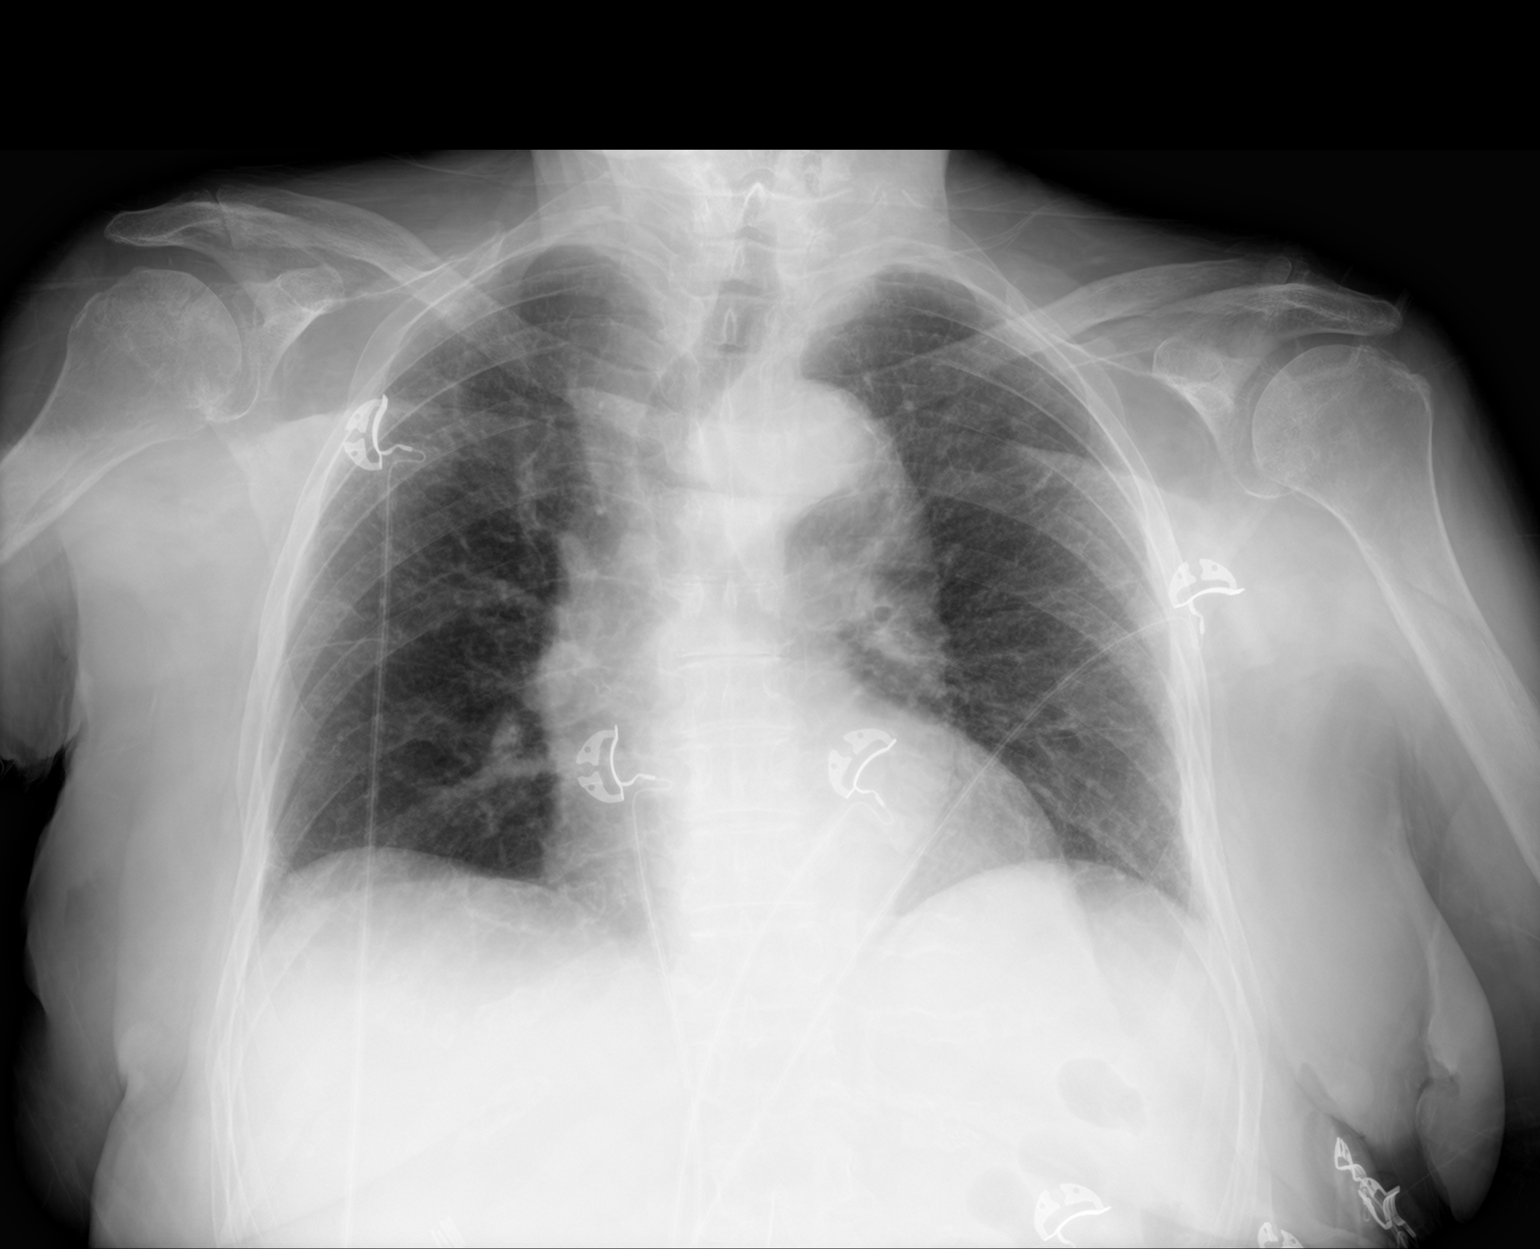

[1 of 1 positions shown; findings below may reference images not displayed]

FINDINGS: Mildly low lung volumes. Mild diffuse coarse interstitial opacity
felt consistent with chronic change. No acute consolidation. Mild
cardiomegaly. Aortic atherosclerosis. No pneumothorax.
IMPRESSION: No active disease.  Borderline to mild cardiomegaly.

## 2019-05-07 IMAGING — DX DG PORTABLE PELVIS
1 series · 1 of 1 positions shown · non-contrast
Comparison: Intraoperative fluoroscopic radiographs dated
07/03/2017

CLINICAL DATA: Status post left hip arthroplasty

EXAM:
PORTABLE PELVIS 1-2 VIEWS

[pelvis ap]
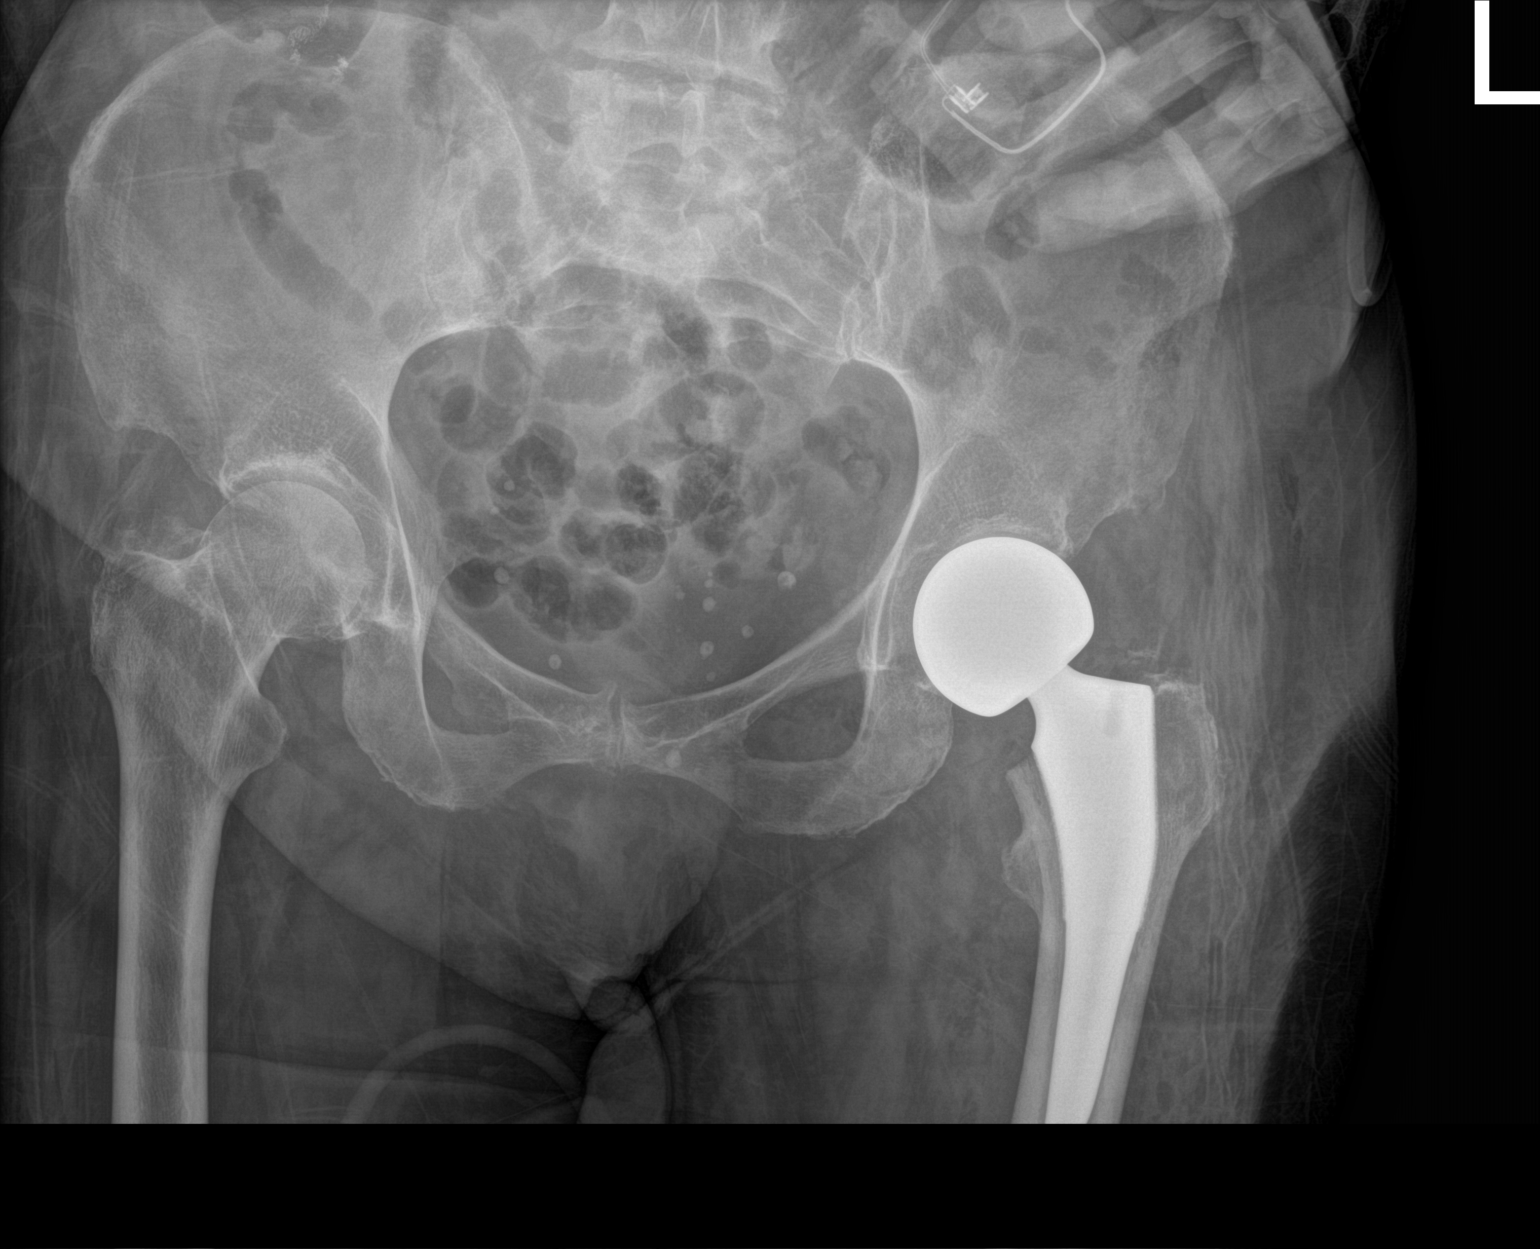

[1 of 1 positions shown; findings below may reference images not displayed]

FINDINGS: Left hip hemiarthroplasty in satisfactory position.

Mild degenerative changes of the right hip.

Visualized bony pelvis appears intact.
IMPRESSION: Left hip hemiarthroplasty in satisfactory position.

## 2019-05-07 IMAGING — DX DG KNEE 1-2V*L*
2 series · 2 of 2 positions shown · non-contrast
Comparison: Left hip radiograph dated 07/02/2017

CLINICAL DATA: [AGE] female with left hip fracture. Prior
left knee replacement.

EXAM:
LEFT KNEE - 1-2 VIEW

[knee ap]
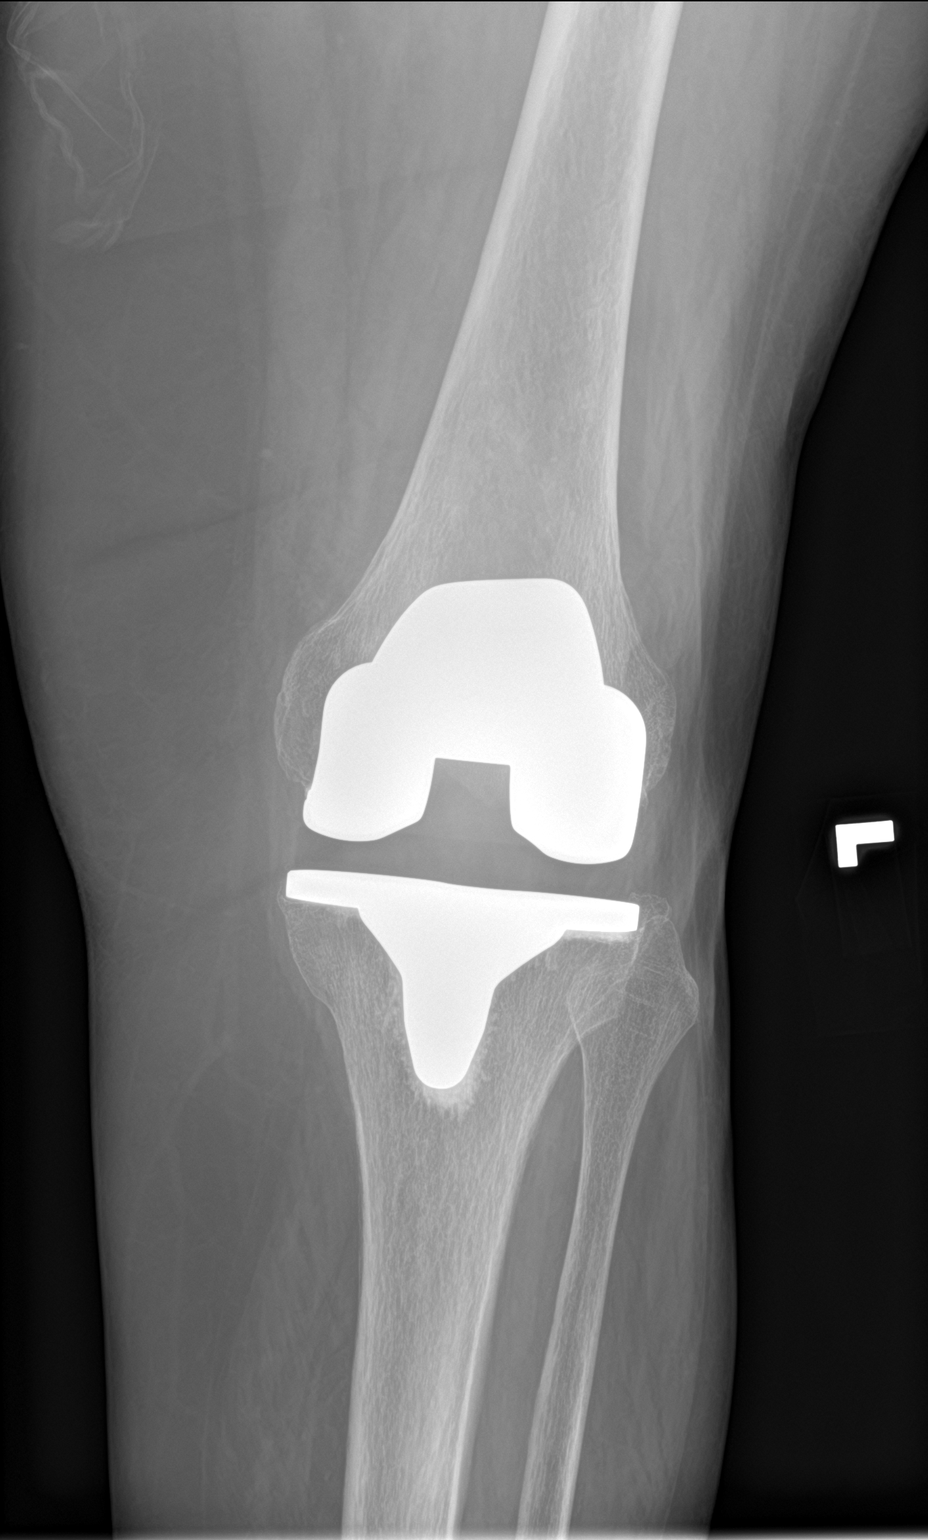

[knee lat]
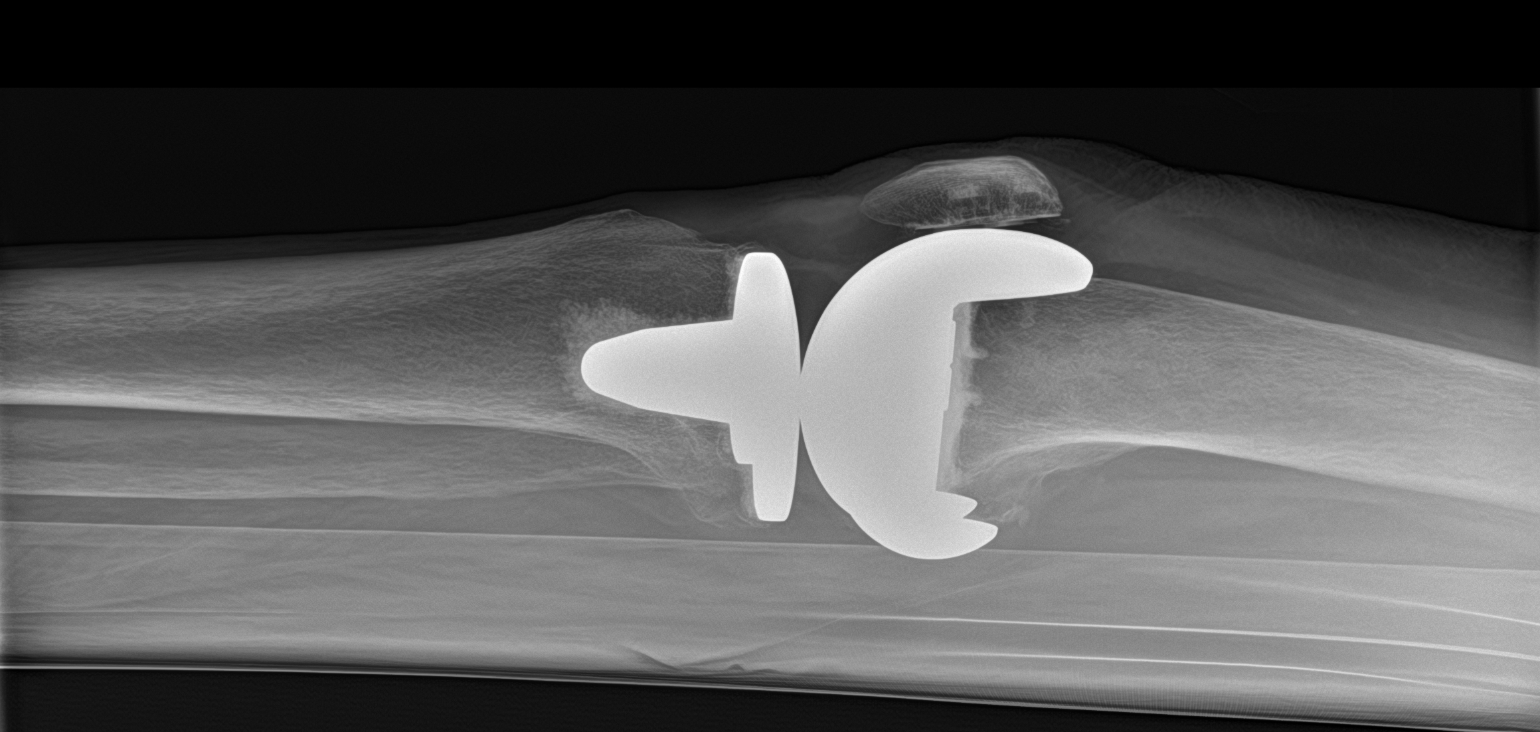

[2 of 2 positions shown; findings below may reference images not displayed]

FINDINGS: There is a total knee arthroplasty. The arthroplasty components
appear intact and in anatomic alignment. No evidence of loosening.
The bones are osteopenic. There is no acute fracture or dislocation.
No joint effusion. The soft tissues appear unremarkable.
IMPRESSION: 1. No acute fracture or dislocation.
2. Osteopenia.
3. Total knee arthroplasty appears intact.
